# Patient Record
Sex: Female | Born: 1935 | Race: White | Hispanic: No | Marital: Married | Smoking: Never smoker
Health system: Southern US, Community
[De-identification: ages and names within clinical notes are randomized; demographics above are authoritative.]

## PROBLEM LIST (undated history)

## (undated) DIAGNOSIS — C4360 Malignant melanoma of unspecified upper limb, including shoulder: Secondary | ICD-10-CM

## (undated) DIAGNOSIS — I10 Essential (primary) hypertension: Secondary | ICD-10-CM

## (undated) DIAGNOSIS — R112 Nausea with vomiting, unspecified: Secondary | ICD-10-CM

## (undated) DIAGNOSIS — C50919 Malignant neoplasm of unspecified site of unspecified female breast: Secondary | ICD-10-CM

## (undated) DIAGNOSIS — Z9889 Other specified postprocedural states: Secondary | ICD-10-CM

## (undated) HISTORY — PX: ABDOMINAL HYSTERECTOMY: SHX81

## (undated) HISTORY — PX: TONSILLECTOMY: SUR1361

## (undated) HISTORY — PX: ACHILLES TENDON REPAIR: SUR1153

## (undated) HISTORY — PX: KNEE ARTHROSCOPY: SUR90

## (undated) HISTORY — PX: APPENDECTOMY: SHX54

## (undated) HISTORY — PX: EYE SURGERY: SHX253

---

## 1984-01-08 HISTORY — PX: BREAST EXCISIONAL BIOPSY: SUR124

## 1992-01-08 HISTORY — PX: BREAST SURGERY: SHX581

## 1992-01-08 HISTORY — PX: MASTECTOMY: SHX3

## 1997-04-14 ENCOUNTER — Other Ambulatory Visit: Admission: RE | Admit: 1997-04-14 | Discharge: 1997-04-14 | Payer: Self-pay | Admitting: Oncology

## 1997-05-31 ENCOUNTER — Other Ambulatory Visit: Admission: RE | Admit: 1997-05-31 | Discharge: 1997-05-31 | Payer: Self-pay | Admitting: Oncology

## 1998-11-09 ENCOUNTER — Encounter: Payer: Self-pay | Admitting: Oncology

## 1998-11-09 ENCOUNTER — Encounter: Admission: RE | Admit: 1998-11-09 | Discharge: 1998-11-09 | Payer: Self-pay | Admitting: Oncology

## 1999-11-21 ENCOUNTER — Encounter: Payer: Self-pay | Admitting: Oncology

## 1999-11-21 ENCOUNTER — Encounter: Admission: RE | Admit: 1999-11-21 | Discharge: 1999-11-21 | Payer: Self-pay | Admitting: Oncology

## 1999-11-27 ENCOUNTER — Other Ambulatory Visit: Admission: RE | Admit: 1999-11-27 | Discharge: 1999-11-27 | Payer: Self-pay | Admitting: Gynecology

## 2000-11-24 ENCOUNTER — Encounter: Payer: Self-pay | Admitting: Oncology

## 2000-11-24 ENCOUNTER — Encounter: Admission: RE | Admit: 2000-11-24 | Discharge: 2000-11-24 | Payer: Self-pay | Admitting: Oncology

## 2001-11-26 ENCOUNTER — Encounter: Payer: Self-pay | Admitting: Oncology

## 2001-11-26 ENCOUNTER — Encounter: Admission: RE | Admit: 2001-11-26 | Discharge: 2001-11-26 | Payer: Self-pay | Admitting: Oncology

## 2002-11-29 ENCOUNTER — Encounter: Admission: RE | Admit: 2002-11-29 | Discharge: 2002-11-29 | Payer: Self-pay | Admitting: Oncology

## 2003-01-24 ENCOUNTER — Other Ambulatory Visit: Admission: RE | Admit: 2003-01-24 | Discharge: 2003-01-24 | Payer: Self-pay | Admitting: Gynecology

## 2003-12-06 ENCOUNTER — Encounter: Admission: RE | Admit: 2003-12-06 | Discharge: 2003-12-06 | Payer: Self-pay | Admitting: Oncology

## 2004-08-13 ENCOUNTER — Ambulatory Visit: Payer: Self-pay | Admitting: Oncology

## 2004-12-07 ENCOUNTER — Encounter: Admission: RE | Admit: 2004-12-07 | Discharge: 2004-12-07 | Payer: Self-pay | Admitting: Oncology

## 2005-08-16 ENCOUNTER — Ambulatory Visit: Payer: Self-pay | Admitting: Oncology

## 2005-08-20 LAB — CBC WITH DIFFERENTIAL/PLATELET
Basophils Absolute: 0 10*3/uL (ref 0.0–0.1)
HGB: 14.5 g/dL (ref 11.6–15.9)
MCV: 85.7 fL (ref 81.0–101.0)
MONO#: 0.6 10*3/uL (ref 0.1–0.9)
MONO%: 10.9 % (ref 0.0–13.0)
NEUT#: 2.5 10*3/uL (ref 1.5–6.5)
NEUT%: 46.3 % (ref 39.6–76.8)
Platelets: 227 10*3/uL (ref 145–400)
RBC: 4.96 10*6/uL (ref 3.70–5.32)
WBC: 5.4 10*3/uL (ref 3.9–10.0)
lymph#: 2.1 10*3/uL (ref 0.9–3.3)

## 2005-08-20 LAB — COMPREHENSIVE METABOLIC PANEL
ALT: 18 U/L (ref 0–40)
Alkaline Phosphatase: 66 U/L (ref 39–117)
BUN: 26 mg/dL — ABNORMAL HIGH (ref 6–23)
Calcium: 10.3 mg/dL (ref 8.4–10.5)
Chloride: 103 mEq/L (ref 96–112)
Creatinine, Ser: 0.97 mg/dL (ref 0.40–1.20)
Total Protein: 7.3 g/dL (ref 6.0–8.3)

## 2005-08-20 LAB — LACTATE DEHYDROGENASE: LDH: 148 U/L (ref 94–250)

## 2005-12-10 ENCOUNTER — Encounter: Admission: RE | Admit: 2005-12-10 | Discharge: 2005-12-10 | Payer: Self-pay | Admitting: Oncology

## 2006-08-15 ENCOUNTER — Ambulatory Visit: Payer: Self-pay | Admitting: Oncology

## 2006-08-19 LAB — COMPREHENSIVE METABOLIC PANEL
Alkaline Phosphatase: 69 U/L (ref 39–117)
BUN: 22 mg/dL (ref 6–23)
CO2: 28 mEq/L (ref 19–32)
Chloride: 103 mEq/L (ref 96–112)
Creatinine, Ser: 0.89 mg/dL (ref 0.40–1.20)
Glucose, Bld: 95 mg/dL (ref 70–99)
Potassium: 4.1 mEq/L (ref 3.5–5.3)
Sodium: 142 mEq/L (ref 135–145)
Total Bilirubin: 0.5 mg/dL (ref 0.3–1.2)
Total Protein: 7.3 g/dL (ref 6.0–8.3)

## 2006-08-19 LAB — CBC WITH DIFFERENTIAL/PLATELET
EOS%: 1.5 % (ref 0.0–7.0)
Eosinophils Absolute: 0.1 10*3/uL (ref 0.0–0.5)
HCT: 42.7 % (ref 34.8–46.6)
Platelets: 210 10*3/uL (ref 145–400)
RBC: 5.02 10*6/uL (ref 3.70–5.32)
WBC: 6.4 10*3/uL (ref 3.9–10.0)

## 2006-12-19 ENCOUNTER — Encounter: Admission: RE | Admit: 2006-12-19 | Discharge: 2006-12-19 | Payer: Self-pay | Admitting: Oncology

## 2007-08-14 ENCOUNTER — Ambulatory Visit: Payer: Self-pay | Admitting: Oncology

## 2007-12-23 ENCOUNTER — Encounter: Admission: RE | Admit: 2007-12-23 | Discharge: 2007-12-23 | Payer: Self-pay | Admitting: Oncology

## 2009-01-25 ENCOUNTER — Encounter: Admission: RE | Admit: 2009-01-25 | Discharge: 2009-01-25 | Payer: Self-pay | Admitting: Gynecology

## 2010-01-27 ENCOUNTER — Encounter: Payer: Self-pay | Admitting: Oncology

## 2010-01-31 ENCOUNTER — Encounter
Admission: RE | Admit: 2010-01-31 | Discharge: 2010-01-31 | Payer: Self-pay | Source: Home / Self Care | Attending: Gynecology | Admitting: Gynecology

## 2010-12-24 ENCOUNTER — Other Ambulatory Visit: Payer: Self-pay | Admitting: Gynecology

## 2010-12-24 DIAGNOSIS — Z1231 Encounter for screening mammogram for malignant neoplasm of breast: Secondary | ICD-10-CM

## 2010-12-24 DIAGNOSIS — Z9889 Other specified postprocedural states: Secondary | ICD-10-CM

## 2011-02-11 ENCOUNTER — Ambulatory Visit
Admission: RE | Admit: 2011-02-11 | Discharge: 2011-02-11 | Disposition: A | Discharge status: Home / Self Care | Payer: Medicare Other | Source: Ambulatory Visit | Attending: Gynecology | Admitting: Gynecology

## 2011-02-11 DIAGNOSIS — Z1231 Encounter for screening mammogram for malignant neoplasm of breast: Secondary | ICD-10-CM

## 2011-02-11 DIAGNOSIS — Z9889 Other specified postprocedural states: Secondary | ICD-10-CM

## 2012-02-24 ENCOUNTER — Other Ambulatory Visit: Payer: Self-pay | Admitting: Gynecology

## 2012-02-24 DIAGNOSIS — Z853 Personal history of malignant neoplasm of breast: Secondary | ICD-10-CM

## 2012-02-24 DIAGNOSIS — Z9011 Acquired absence of right breast and nipple: Secondary | ICD-10-CM

## 2012-02-24 DIAGNOSIS — Z1231 Encounter for screening mammogram for malignant neoplasm of breast: Secondary | ICD-10-CM

## 2012-03-27 ENCOUNTER — Ambulatory Visit
Admission: RE | Admit: 2012-03-27 | Discharge: 2012-03-27 | Disposition: A | Discharge status: Home / Self Care | Payer: Medicare Other | Source: Ambulatory Visit | Attending: Gynecology | Admitting: Gynecology

## 2012-03-27 DIAGNOSIS — Z853 Personal history of malignant neoplasm of breast: Secondary | ICD-10-CM

## 2012-03-27 DIAGNOSIS — Z1231 Encounter for screening mammogram for malignant neoplasm of breast: Secondary | ICD-10-CM

## 2012-03-27 DIAGNOSIS — Z9011 Acquired absence of right breast and nipple: Secondary | ICD-10-CM

## 2013-04-23 ENCOUNTER — Other Ambulatory Visit: Payer: Self-pay

## 2013-04-23 DIAGNOSIS — Z1231 Encounter for screening mammogram for malignant neoplasm of breast: Secondary | ICD-10-CM

## 2013-05-12 ENCOUNTER — Encounter (INDEPENDENT_AMBULATORY_CARE_PROVIDER_SITE_OTHER): Payer: Self-pay

## 2013-05-12 ENCOUNTER — Ambulatory Visit
Admission: RE | Admit: 2013-05-12 | Discharge: 2013-05-12 | Disposition: A | Discharge status: Home / Self Care | Payer: Medicare Other | Source: Ambulatory Visit

## 2013-05-12 DIAGNOSIS — Z1231 Encounter for screening mammogram for malignant neoplasm of breast: Secondary | ICD-10-CM

## 2013-07-14 ENCOUNTER — Encounter (HOSPITAL_BASED_OUTPATIENT_CLINIC_OR_DEPARTMENT_OTHER): Payer: Self-pay | Admitting: *Deleted

## 2013-07-14 NOTE — H&P (Signed)
  Subjective:   Patient ID: Mindy Meyer is a 80 y.o. female.  HPI  Referred by Dr. Ubaldo Glassing for consultation following recent shave biopsy back lesion demonstrating 0.24 mm melanoma, per path report deep margin clear and peripheral margins involved. Patient has hx multiple BCC, this is first melanoma diagnosis. Lesion has been present for few years time, no history ulceration or bleeding. Has history of right mastectomy with lymph node sampling for breast cancer. Last MMG this spring.  Review of Systems  Musculoskeletal: Positive for back pain.    Objective:   Physical Exam  Cardiovascular: Normal rate and regular rhythm.  Pulmonary/Chest: Breath sounds normal.  Abdominal: Soft.  Lymphadenopathy:  Head (right side): No occipital adenopathy present.  Head (left side): No occipital adenopathy present.  She has no cervical adenopathy.  She has no axillary adenopathy.  Right: No inguinal adenopathy present.  Left: No inguinal adenopathy present.  Skin:  Left upper back with open biopsy site, area of lighter pigmentation surrounding total area 3.5 x 3 cm    Assessment:    Melanoma of back   Plan:    Reviewed biopsy results and copy provided. Given current depth no recommendation for SLN. Will plan WLE with 1-2 cm margins. Will plan VAC at time of excision as we await final pathology and plan second stage surgery with skin graft and continued use VAC. May require home health during the course of this. Reviewed need for additional procedures or oncology referral pending final pathology. Reviewed prolonged wound care if graft does not take well. Patient reports significant nausea on days 2-3 post surgery, does not tolerate any -odones pain medication, Has not tried [employer]

## 2013-07-16 ENCOUNTER — Encounter (HOSPITAL_BASED_OUTPATIENT_CLINIC_OR_DEPARTMENT_OTHER)
Admission: RE | Disposition: A | Discharge status: Home / Self Care | Payer: Medicare Other | Source: Ambulatory Visit | Attending: Plastic Surgery

## 2013-07-16 ENCOUNTER — Encounter (HOSPITAL_BASED_OUTPATIENT_CLINIC_OR_DEPARTMENT_OTHER): Payer: Medicare Other | Admitting: Anesthesiology

## 2013-07-16 ENCOUNTER — Ambulatory Visit (HOSPITAL_BASED_OUTPATIENT_CLINIC_OR_DEPARTMENT_OTHER)
Admission: RE | Admit: 2013-07-16 | Discharge: 2013-07-16 | Disposition: A | Discharge status: Home / Self Care | Payer: Medicare Other | Source: Ambulatory Visit | Attending: Plastic Surgery | Admitting: Plastic Surgery

## 2013-07-16 ENCOUNTER — Ambulatory Visit (HOSPITAL_BASED_OUTPATIENT_CLINIC_OR_DEPARTMENT_OTHER): Payer: Medicare Other | Admitting: Anesthesiology

## 2013-07-16 ENCOUNTER — Encounter (HOSPITAL_BASED_OUTPATIENT_CLINIC_OR_DEPARTMENT_OTHER): Payer: Self-pay

## 2013-07-16 DIAGNOSIS — C439 Malignant melanoma of skin, unspecified: Secondary | ICD-10-CM

## 2013-07-16 DIAGNOSIS — I10 Essential (primary) hypertension: Secondary | ICD-10-CM | POA: Insufficient documentation

## 2013-07-16 DIAGNOSIS — C4359 Malignant melanoma of other part of trunk: Secondary | ICD-10-CM | POA: Insufficient documentation

## 2013-07-16 DIAGNOSIS — Z9010 Acquired absence of unspecified breast and nipple: Secondary | ICD-10-CM | POA: Insufficient documentation

## 2013-07-16 DIAGNOSIS — Z853 Personal history of malignant neoplasm of breast: Secondary | ICD-10-CM | POA: Insufficient documentation

## 2013-07-16 HISTORY — PX: MASS EXCISION: SHX2000

## 2013-07-16 HISTORY — DX: Nausea with vomiting, unspecified: R11.2

## 2013-07-16 HISTORY — DX: Other specified postprocedural states: Z98.890

## 2013-07-16 HISTORY — DX: Malignant melanoma of unspecified upper limb, including shoulder: C43.60

## 2013-07-16 HISTORY — DX: Essential (primary) hypertension: I10

## 2013-07-16 LAB — CBC WITH DIFFERENTIAL/PLATELET
Basophils Absolute: 0 10*3/uL (ref 0.0–0.1)
Basophils Relative: 1 % (ref 0–1)
Eosinophils Absolute: 0.2 10*3/uL (ref 0.0–0.7)
Eosinophils Relative: 2 % (ref 0–5)
HCT: 42.4 % (ref 36.0–46.0)
Hemoglobin: 13.9 g/dL (ref 12.0–15.0)
LYMPHS ABS: 1.9 10*3/uL (ref 0.7–4.0)
LYMPHS PCT: 29 % (ref 12–46)
MCH: 28.7 pg (ref 26.0–34.0)
MCHC: 32.8 g/dL (ref 30.0–36.0)
MCV: 87.6 fL (ref 78.0–100.0)
MONOS PCT: 12 % (ref 3–12)
Monocytes Absolute: 0.8 10*3/uL (ref 0.1–1.0)
NEUTROS PCT: 56 % (ref 43–77)
Neutro Abs: 3.7 10*3/uL (ref 1.7–7.7)
Platelets: 210 10*3/uL (ref 150–400)
RBC: 4.84 MIL/uL (ref 3.87–5.11)
RDW: 13.9 % (ref 11.5–15.5)
WBC: 6.5 10*3/uL (ref 4.0–10.5)

## 2013-07-16 SURGERY — EXCISION MASS
Anesthesia: Monitor Anesthesia Care | Site: Back

## 2013-07-16 MED ORDER — TRAMADOL HCL 50 MG PO TABS
ORAL_TABLET | ORAL | Status: AC
  Filled 2013-07-16: qty 1

## 2013-07-16 MED ORDER — ONDANSETRON HCL 4 MG/2ML IJ SOLN
4.0000 mg | Freq: Once | INTRAMUSCULAR | Status: DC | PRN
Start: 2013-07-16 — End: 2013-07-16

## 2013-07-16 MED ORDER — FENTANYL CITRATE 0.05 MG/ML IJ SOLN
INTRAMUSCULAR | Status: AC
  Filled 2013-07-16: qty 4

## 2013-07-16 MED ORDER — PROPOFOL INFUSION 10 MG/ML OPTIME
INTRAVENOUS | Status: DC | PRN
  Administered 2013-07-16: 50 ug/kg/min via INTRAVENOUS

## 2013-07-16 MED ORDER — CHLORHEXIDINE GLUCONATE 4 % EX LIQD: 1.0000 "application " | Freq: Once | CUTANEOUS | Status: DC

## 2013-07-16 MED ORDER — BUPIVACAINE-EPINEPHRINE (PF) 0.25% -1:200000 IJ SOLN
INTRAMUSCULAR | Status: AC
  Filled 2013-07-16: qty 30

## 2013-07-16 MED ORDER — CEFAZOLIN SODIUM-DEXTROSE 2-3 GM-% IV SOLR
2.0000 g | INTRAVENOUS | Status: AC
  Administered 2013-07-16: 2 g via INTRAVENOUS

## 2013-07-16 MED ORDER — BUPIVACAINE-EPINEPHRINE 0.25% -1:200000 IJ SOLN
INTRAMUSCULAR | Status: DC | PRN
  Administered 2013-07-16: 16 mL

## 2013-07-16 MED ORDER — CEFAZOLIN SODIUM-DEXTROSE 2-3 GM-% IV SOLR
INTRAVENOUS | Status: AC
  Filled 2013-07-16: qty 50

## 2013-07-16 MED ORDER — FENTANYL CITRATE 0.05 MG/ML IJ SOLN: 25.0000 ug | INTRAMUSCULAR | Status: DC | PRN

## 2013-07-16 MED ORDER — LACTATED RINGERS IV SOLN
INTRAVENOUS | Status: DC
Start: 2013-07-16 — End: 2013-07-16
  Administered 2013-07-16: 07:00:00 via INTRAVENOUS

## 2013-07-16 MED ORDER — FENTANYL CITRATE 0.05 MG/ML IJ SOLN
INTRAMUSCULAR | Status: DC | PRN
Start: 2013-07-16 — End: 2013-07-16
  Administered 2013-07-16: 25 ug via INTRAVENOUS

## 2013-07-16 MED ORDER — LIDOCAINE HCL (CARDIAC) 10 MG/ML IV SOLN
INTRAVENOUS | Status: DC | PRN
  Administered 2013-07-16: 50 mg via INTRAVENOUS

## 2013-07-16 MED ORDER — TRAMADOL HCL 50 MG PO TABS: 25.0000 mg | ORAL_TABLET | Freq: Four times a day (QID) | ORAL | Status: DC | PRN

## 2013-07-16 MED ORDER — ONDANSETRON HCL 4 MG/2ML IJ SOLN
INTRAMUSCULAR | Status: DC | PRN
  Administered 2013-07-16: 4 mg via INTRAVENOUS

## 2013-07-16 MED ORDER — MIDAZOLAM HCL 2 MG/2ML IJ SOLN: 1.0000 mg | INTRAMUSCULAR | Status: DC | PRN

## 2013-07-16 MED ORDER — BACITRACIN ZINC 500 UNIT/GM EX OINT
TOPICAL_OINTMENT | CUTANEOUS | Status: AC
  Filled 2013-07-16: qty 0.9

## 2013-07-16 MED ORDER — TRAMADOL HCL 50 MG PO TABS
50.0000 mg | ORAL_TABLET | Freq: Once | ORAL | Status: AC
  Administered 2013-07-16: 50 mg via ORAL

## 2013-07-16 MED ORDER — FENTANYL CITRATE 0.05 MG/ML IJ SOLN: 50.0000 ug | INTRAMUSCULAR | Status: DC | PRN

## 2013-07-16 MED ORDER — LIDOCAINE-EPINEPHRINE 1 %-1:100000 IJ SOLN
INTRAMUSCULAR | Status: AC
  Filled 2013-07-16: qty 1

## 2013-07-16 SURGICAL SUPPLY — 56 items
BENZOIN TINCTURE PRP APPL 2/3 (GAUZE/BANDAGES/DRESSINGS) ×3 IMPLANT
BLADE SURG 10 STRL SS (BLADE) ×3 IMPLANT
BLADE SURG 15 STRL LF DISP TIS (BLADE) ×1 IMPLANT
BLADE SURG 15 STRL SS (BLADE) ×2
BLADE SURG ROTATE 9660 (MISCELLANEOUS) IMPLANT
CANISTER SUCT 1200ML W/VALVE (MISCELLANEOUS) IMPLANT
CHLORAPREP W/TINT 26ML (MISCELLANEOUS) ×3 IMPLANT
CLOSURE WOUND 1/2 X4 (GAUZE/BANDAGES/DRESSINGS)
COVER MAYO STAND STRL (DRAPES) ×3 IMPLANT
COVER TABLE BACK 60X90 (DRAPES) ×3 IMPLANT
DERMABOND ADVANCED (GAUZE/BANDAGES/DRESSINGS)
DERMABOND ADVANCED .7 DNX12 (GAUZE/BANDAGES/DRESSINGS) IMPLANT
DRAIN JP 10F RND SILICONE (MISCELLANEOUS) IMPLANT
DRAPE PED LAPAROTOMY (DRAPES) ×3 IMPLANT
DRAPE U-SHAPE 76X120 STRL (DRAPES) IMPLANT
ELECT COATED BLADE 2.86 ST (ELECTRODE) IMPLANT
ELECT NEEDLE BLADE 2-5/6 (NEEDLE) ×3 IMPLANT
ELECT REM PT RETURN 9FT ADLT (ELECTROSURGICAL)
ELECT REM PT RETURN 9FT PED (ELECTROSURGICAL)
ELECTRODE REM PT RETRN 9FT PED (ELECTROSURGICAL) IMPLANT
ELECTRODE REM PT RTRN 9FT ADLT (ELECTROSURGICAL) IMPLANT
EVACUATOR SILICONE 100CC (DRAIN) IMPLANT
GAUZE XEROFORM 1X8 LF (GAUZE/BANDAGES/DRESSINGS) IMPLANT
GLOVE BIO SURGEON STRL SZ 6 (GLOVE) ×3 IMPLANT
GLOVE BIOGEL PI IND STRL 7.0 (GLOVE) ×2 IMPLANT
GLOVE BIOGEL PI INDICATOR 7.0 (GLOVE) ×4
GLOVE ECLIPSE 7.0 STRL STRAW (GLOVE) ×3 IMPLANT
GLOVE SURG SS PI 7.0 STRL IVOR (GLOVE) ×3 IMPLANT
GOWN STRL REUS W/ TWL LRG LVL3 (GOWN DISPOSABLE) ×3 IMPLANT
GOWN STRL REUS W/TWL LRG LVL3 (GOWN DISPOSABLE) ×6
NEEDLE 27GAX1X1/2 (NEEDLE) ×3 IMPLANT
NEEDLE HYPO 30GX1 BEV (NEEDLE) IMPLANT
NS IRRIG 1000ML POUR BTL (IV SOLUTION) IMPLANT
PACK BASIN DAY SURGERY FS (CUSTOM PROCEDURE TRAY) ×3 IMPLANT
PENCIL BUTTON HOLSTER BLD 10FT (ELECTRODE) ×3 IMPLANT
RUBBERBAND STERILE (MISCELLANEOUS) IMPLANT
SHEET MEDIUM DRAPE 40X70 STRL (DRAPES) ×3 IMPLANT
SPONGE GAUZE 2X2 8PLY STER LF (GAUZE/BANDAGES/DRESSINGS)
SPONGE GAUZE 2X2 8PLY STRL LF (GAUZE/BANDAGES/DRESSINGS) IMPLANT
SPONGE GAUZE 4X4 12PLY STER LF (GAUZE/BANDAGES/DRESSINGS) IMPLANT
SPONGE LAP 18X18 X RAY DECT (DISPOSABLE) IMPLANT
STRIP CLOSURE SKIN 1/2X4 (GAUZE/BANDAGES/DRESSINGS) IMPLANT
SUCTION FRAZIER TIP 10 FR DISP (SUCTIONS) IMPLANT
SUT ETHILON 4 0 PS 2 18 (SUTURE) IMPLANT
SUT ETHILON 5 0 P 3 18 (SUTURE)
SUT MNCRL AB 4-0 PS2 18 (SUTURE) IMPLANT
SUT NYLON ETHILON 5-0 P-3 1X18 (SUTURE) IMPLANT
SUT PLAIN 5 0 P 3 18 (SUTURE) IMPLANT
SUT SILK 3 0 SH 30 (SUTURE) ×3 IMPLANT
SUT VICRYL 4-0 PS2 18IN ABS (SUTURE) IMPLANT
SYR BULB 3OZ (MISCELLANEOUS) IMPLANT
SYRINGE CONTROL L 12CC (SYRINGE) ×3 IMPLANT
TOWEL OR 17X24 6PK STRL BLUE (TOWEL DISPOSABLE) ×3 IMPLANT
TRAY DSU PREP LF (CUSTOM PROCEDURE TRAY) IMPLANT
TUBE CONNECTING 20'X1/4 (TUBING)
TUBE CONNECTING 20X1/4 (TUBING) IMPLANT

## 2013-07-16 NOTE — Transfer of Care (Signed)
Immediate Anesthesia Transfer of Care Note  Patient: Mindy Meyer  Procedure(s) Performed: Procedure(s): EXCISION OF MALIGNANT LESION ON BACK (N/A)  Patient Location: PACU  Anesthesia Type:MAC  Level of Consciousness: awake, alert , oriented and patient cooperative  Airway & Oxygen Therapy: Patient Spontanous Breathing and Patient connected to face mask oxygen  Post-op Assessment: Report given to PACU RN and Post -op Vital signs reviewed and stable  Post vital signs: Reviewed and stable  Complications: No apparent anesthesia complications

## 2013-07-16 NOTE — Interval H&P Note (Signed)
History and Physical Interval Note:  07/16/2013 7:05 AM  Mindy Meyer  has presented today for surgery, with the diagnosis of Melanoma of skin, site unspecified  The various methods of treatment have been discussed with the patient and family. After consideration of risks, benefits and other options for treatment, the patient has consented to  Procedure(s): EXCISION OF MALIGNANT LESION ON BACK (N/A) as a surgical intervention .  The patient's history has been reviewed, patient examined, no change in status, stable for surgery.  I have reviewed the patient's chart and labs.  Questions were answered to the patient's satisfaction.     Bertina Guthridge

## 2013-07-16 NOTE — Anesthesia Postprocedure Evaluation (Signed)
  Anesthesia Post-op Note  Patient: Mindy Meyer  Procedure(s) Performed: Procedure(s): EXCISION OF MALIGNANT LESION ON BACK (N/A)  Patient Location: PACU  Anesthesia Type: MAC   Level of Consciousness: awake, alert  and oriented  Airway and Oxygen Therapy: Patient Spontanous Breathing  Post-op Pain: none  Post-op Assessment: Post-op Vital signs reviewed  Post-op Vital Signs: Reviewed  Last Vitals:  Filed Vitals:   07/16/13 0830  BP: 154/53  Pulse: 62  Temp:   Resp: 12    Complications: No apparent anesthesia complications

## 2013-07-16 NOTE — Anesthesia Preprocedure Evaluation (Addendum)
Anesthesia Evaluation  Patient identified by MRN, date of birth, ID band Patient awake    Reviewed: Allergy & Precautions, H&P , NPO status , Patient's Chart, lab work & pertinent test results  History of Anesthesia Complications (+) PONV and history of anesthetic complications (Mostly from pain meds day after surgery)  Airway Mallampati: I TM Distance: >3 FB Neck ROM: Full    Dental  (+) Teeth Intact, Dental Advisory Given   Pulmonary  breath sounds clear to auscultation        Cardiovascular hypertension, Pt. on medications Rhythm:Regular Rate:Normal     Neuro/Psych    GI/Hepatic   Endo/Other    Renal/GU      Musculoskeletal   Abdominal   Peds  Hematology   Anesthesia Other Findings   Reproductive/Obstetrics                          Anesthesia Physical Anesthesia Plan  ASA: II  Anesthesia Plan: MAC   Post-op Pain Management:    Induction: Intravenous  Airway Management Planned: Simple Face Mask  Additional Equipment:   Intra-op Plan:   Post-operative Plan:   Informed Consent: I have reviewed the patients History and Physical, chart, labs and discussed the procedure including the risks, benefits and alternatives for the proposed anesthesia with the patient or authorized representative who has indicated his/her understanding and acceptance.   Dental advisory given  Plan Discussed with: CRNA, Anesthesiologist and Surgeon  Anesthesia Plan Comments:         Anesthesia Quick Evaluation

## 2013-07-16 NOTE — Op Note (Signed)
Operative Note   DATE OF OPERATION: 7.10.2015  LOCATION: [facility]-outpatient  SURGICAL DIVISION: Plastic Surgery  PREOPERATIVE DIAGNOSES:  Melanoma back  POSTOPERATIVE DIAGNOSES:  same  PROCEDURE:  Excision malignant lesion back 5.5 cm, application wound VAC  SURGEON: Irene Limbo MD MBA  ASSISTANT: none  ANESTHESIA:  MAC.   EBL: minimal  COMPLICATIONS: None immediate.   INDICATIONS FOR PROCEDURE:  The patient, Mindy Meyer, is a 80 y.o. female born on 1925/07/03, is here for wide excision of lesion  0.24 mm superficial spreading melanoma on shave biopsy. No history ulceration or bleeding of lesion. Has had adjacent benign lesions treated by cryotherapy recently.    FINDINGS: Healing biopsy site centrally with surrounding lightly pigmented area. Medially, portion of lesions recently treated with liquid nitrogen were included in specimen resection. Plan second stage procedure for skin grafting once pathologic clearance obtained.  DESCRIPTION OF PROCEDURE:  The patient's operative site was marked with the patient in the preoperative area. The patient was taken to the operating room. SCDs were placed and IV antibiotics were given. The patient's operative site was prepped and draped in a sterile fashion. A time out was performed and all information was confirmed to be correct.  One cm margins marked from all visible pigmented borders. Area infiltrated with local anesthetic. Sharp excision of lesion including underlying fascia completed. Specimen marked for pathology. Hemostasis obtained. Wound VAC applied and set to 125 mm Hg continuous.  The patient was allowed to wake from anesthesia, extubated and taken to the recovery room in satisfactory condition.   SPECIMENS: melanoma back  DRAINS: none  Irene Limbo, MD [facility] Plastic & Reconstructive Surgery [phone]

## 2013-07-16 NOTE — Discharge Instructions (Signed)

## 2013-07-19 ENCOUNTER — Encounter (HOSPITAL_BASED_OUTPATIENT_CLINIC_OR_DEPARTMENT_OTHER): Payer: Self-pay | Admitting: Plastic Surgery

## 2013-07-19 LAB — POCT I-STAT, CHEM 8
BUN: 17 mg/dL (ref 6–23)
Calcium, Ion: 1.29 mmol/L (ref 1.13–1.30)
Chloride: 100 mEq/L (ref 96–112)
Creatinine, Ser: 0.9 mg/dL (ref 0.50–1.10)
Glucose, Bld: 110 mg/dL — ABNORMAL HIGH (ref 70–99)
HCT: 46 % (ref 36.0–46.0)
HEMOGLOBIN: 15.6 g/dL — AB (ref 12.0–15.0)
Potassium: 3.6 mEq/L — ABNORMAL LOW (ref 3.7–5.3)
SODIUM: 143 meq/L (ref 137–147)
TCO2: 29 mmol/L (ref 0–100)

## 2013-07-20 ENCOUNTER — Encounter (HOSPITAL_BASED_OUTPATIENT_CLINIC_OR_DEPARTMENT_OTHER): Payer: Self-pay | Admitting: *Deleted

## 2013-07-21 NOTE — H&P (Signed)
  Patient ID: Mindy Meyer is a 80 y.o. female.  HPI   Patient returns for skin grafting post WLE melanoma last week. Pathology with residual melanoma, 0.2 mm, margins clear. Prior shave biopsy back lesion demonstrated 0.24 mm melanoma, per path report deep margin clear and peripheral margins involved. Patient has hx multiple BCC, this is first melanoma diagnosis. Lesion has been present for few years time, no history ulceration or bleeding. Has history of right mastectomy with lymph node sampling for breast cancer.  Review of Systems  Musculoskeletal: Positive for back pain.    Objective:   Physical Exam  Cardiovascular: Normal rate and regular rhythm.  Pulmonary/Chest: Breath sounds normal.  Back: VAC in place    Assessment:    Melanoma of back   Plan:    Plan coverage of wound with split thickness graft. Use of VAC as bolster planned. Discussed risk of poor wound healing, need for additional procedures, recurrence, unacceptable cosmetic appearance, depressed nature of scar.   Irene Limbo, MD [facility] Plastic & Reconstructive Surgery [phone]

## 2013-07-23 ENCOUNTER — Ambulatory Visit (HOSPITAL_BASED_OUTPATIENT_CLINIC_OR_DEPARTMENT_OTHER): Payer: Medicare Other | Admitting: Anesthesiology

## 2013-07-23 ENCOUNTER — Encounter (HOSPITAL_BASED_OUTPATIENT_CLINIC_OR_DEPARTMENT_OTHER): Payer: Medicare Other | Admitting: Anesthesiology

## 2013-07-23 ENCOUNTER — Ambulatory Visit (HOSPITAL_BASED_OUTPATIENT_CLINIC_OR_DEPARTMENT_OTHER)
Admission: RE | Admit: 2013-07-23 | Discharge: 2013-07-23 | Disposition: A | Discharge status: Home / Self Care | Payer: Medicare Other | Source: Ambulatory Visit | Attending: Plastic Surgery | Admitting: Plastic Surgery

## 2013-07-23 ENCOUNTER — Encounter (HOSPITAL_BASED_OUTPATIENT_CLINIC_OR_DEPARTMENT_OTHER)
Admission: RE | Disposition: A | Discharge status: Home / Self Care | Payer: Self-pay | Source: Ambulatory Visit | Attending: Plastic Surgery

## 2013-07-23 ENCOUNTER — Encounter (HOSPITAL_BASED_OUTPATIENT_CLINIC_OR_DEPARTMENT_OTHER): Payer: Self-pay | Admitting: *Deleted

## 2013-07-23 DIAGNOSIS — Z428 Encounter for other plastic and reconstructive surgery following medical procedure or healed injury: Secondary | ICD-10-CM | POA: Diagnosis not present

## 2013-07-23 DIAGNOSIS — C4359 Malignant melanoma of other part of trunk: Secondary | ICD-10-CM

## 2013-07-23 DIAGNOSIS — Z85820 Personal history of malignant melanoma of skin: Secondary | ICD-10-CM | POA: Diagnosis not present

## 2013-07-23 DIAGNOSIS — Z853 Personal history of malignant neoplasm of breast: Secondary | ICD-10-CM | POA: Insufficient documentation

## 2013-07-23 DIAGNOSIS — Z9010 Acquired absence of unspecified breast and nipple: Secondary | ICD-10-CM | POA: Insufficient documentation

## 2013-07-23 DIAGNOSIS — Z85828 Personal history of other malignant neoplasm of skin: Secondary | ICD-10-CM | POA: Insufficient documentation

## 2013-07-23 DIAGNOSIS — I10 Essential (primary) hypertension: Secondary | ICD-10-CM | POA: Insufficient documentation

## 2013-07-23 HISTORY — PX: SKIN SPLIT GRAFT: SHX444

## 2013-07-23 SURGERY — APPLICATION, GRAFT, SKIN, SPLIT-THICKNESS
Anesthesia: General | Site: Thigh | Laterality: Left

## 2013-07-23 MED ORDER — BACITRACIN ZINC 500 UNIT/GM EX OINT
TOPICAL_OINTMENT | CUTANEOUS | Status: AC
  Filled 2013-07-23: qty 56.7

## 2013-07-23 MED ORDER — FENTANYL CITRATE 0.05 MG/ML IJ SOLN
25.0000 ug | INTRAMUSCULAR | Status: DC | PRN
  Administered 2013-07-23: 25 ug via INTRAVENOUS
  Administered 2013-07-23: 50 ug via INTRAVENOUS

## 2013-07-23 MED ORDER — SUCCINYLCHOLINE CHLORIDE 20 MG/ML IJ SOLN
INTRAMUSCULAR | Status: AC
  Filled 2013-07-23: qty 1

## 2013-07-23 MED ORDER — SCOPOLAMINE 1 MG/3DAYS TD PT72
MEDICATED_PATCH | TRANSDERMAL | Status: AC
  Filled 2013-07-23: qty 1

## 2013-07-23 MED ORDER — MINERAL OIL LIGHT 100 % EX OIL
TOPICAL_OIL | CUTANEOUS | Status: AC
  Filled 2013-07-23: qty 50

## 2013-07-23 MED ORDER — FENTANYL CITRATE 0.05 MG/ML IJ SOLN
INTRAMUSCULAR | Status: AC
  Filled 2013-07-23: qty 6

## 2013-07-23 MED ORDER — LIDOCAINE HCL (CARDIAC) 20 MG/ML IV SOLN
INTRAVENOUS | Status: DC | PRN
  Administered 2013-07-23: 30 mg via INTRAVENOUS

## 2013-07-23 MED ORDER — ONDANSETRON HCL 4 MG/2ML IJ SOLN
INTRAMUSCULAR | Status: DC | PRN
  Administered 2013-07-23: 4 mg via INTRAVENOUS

## 2013-07-23 MED ORDER — FENTANYL CITRATE 0.05 MG/ML IJ SOLN
INTRAMUSCULAR | Status: DC | PRN
  Administered 2013-07-23 (×2): 25 ug via INTRAVENOUS

## 2013-07-23 MED ORDER — LIDOCAINE-EPINEPHRINE 1 %-1:100000 IJ SOLN
INTRAMUSCULAR | Status: AC
  Filled 2013-07-23: qty 2

## 2013-07-23 MED ORDER — CEFAZOLIN SODIUM-DEXTROSE 2-3 GM-% IV SOLR: 2.0000 g | INTRAVENOUS | Status: DC

## 2013-07-23 MED ORDER — FENTANYL CITRATE 0.05 MG/ML IJ SOLN
INTRAMUSCULAR | Status: AC
  Filled 2013-07-23: qty 2

## 2013-07-23 MED ORDER — ONDANSETRON HCL 4 MG/2ML IJ SOLN: 4.0000 mg | Freq: Once | INTRAMUSCULAR | Status: DC | PRN

## 2013-07-23 MED ORDER — CEFAZOLIN SODIUM-DEXTROSE 2-3 GM-% IV SOLR
INTRAVENOUS | Status: AC
  Filled 2013-07-23: qty 50

## 2013-07-23 MED ORDER — MIDAZOLAM HCL 2 MG/2ML IJ SOLN: 1.0000 mg | INTRAMUSCULAR | Status: DC | PRN

## 2013-07-23 MED ORDER — BUPIVACAINE-EPINEPHRINE (PF) 0.25% -1:200000 IJ SOLN
INTRAMUSCULAR | Status: AC
  Filled 2013-07-23: qty 60

## 2013-07-23 MED ORDER — LACTATED RINGERS IV SOLN
INTRAVENOUS | Status: DC
  Administered 2013-07-23 (×2): via INTRAVENOUS

## 2013-07-23 MED ORDER — PROPOFOL 10 MG/ML IV BOLUS
INTRAVENOUS | Status: AC
  Filled 2013-07-23: qty 100

## 2013-07-23 MED ORDER — DEXAMETHASONE SODIUM PHOSPHATE 4 MG/ML IJ SOLN
INTRAMUSCULAR | Status: DC | PRN
  Administered 2013-07-23: 10 mg via INTRAVENOUS

## 2013-07-23 MED ORDER — MIDAZOLAM HCL 2 MG/2ML IJ SOLN
INTRAMUSCULAR | Status: AC
  Filled 2013-07-23: qty 2

## 2013-07-23 MED ORDER — SCOPOLAMINE 1 MG/3DAYS TD PT72
MEDICATED_PATCH | TRANSDERMAL | Status: DC | PRN
  Administered 2013-07-23: 1.5 mg via TRANSDERMAL

## 2013-07-23 MED ORDER — ONDANSETRON 8 MG PO TBDP: 8.0000 mg | ORAL_TABLET | Freq: Three times a day (TID) | ORAL | Status: DC | PRN

## 2013-07-23 MED ORDER — FENTANYL CITRATE 0.05 MG/ML IJ SOLN: 50.0000 ug | INTRAMUSCULAR | Status: DC | PRN

## 2013-07-23 MED ORDER — PROPOFOL 10 MG/ML IV BOLUS
INTRAVENOUS | Status: DC | PRN
  Administered 2013-07-23: 170 mg via INTRAVENOUS

## 2013-07-23 MED ORDER — BUPIVACAINE-EPINEPHRINE 0.25% -1:200000 IJ SOLN
INTRAMUSCULAR | Status: DC | PRN
  Administered 2013-07-23: 14 mL

## 2013-07-23 MED ORDER — MINERAL OIL LIGHT 100 % EX OIL
TOPICAL_OIL | CUTANEOUS | Status: DC | PRN
  Administered 2013-07-23: 1 via TOPICAL

## 2013-07-23 SURGICAL SUPPLY — 73 items
BANDAGE ELASTIC 3 VELCRO ST LF (GAUZE/BANDAGES/DRESSINGS) IMPLANT
BANDAGE ELASTIC 4 VELCRO ST LF (GAUZE/BANDAGES/DRESSINGS) IMPLANT
BANDAGE ELASTIC 6 VELCRO ST LF (GAUZE/BANDAGES/DRESSINGS) IMPLANT
BENZOIN TINCTURE PRP APPL 2/3 (GAUZE/BANDAGES/DRESSINGS) ×2 IMPLANT
BLADE DERMATOME SS (BLADE) ×2 IMPLANT
BLADE HEX COATED 2.75 (ELECTRODE) IMPLANT
BLADE SURG 10 STRL SS (BLADE) IMPLANT
BLADE SURG 15 STRL LF DISP TIS (BLADE) ×1 IMPLANT
BLADE SURG 15 STRL SS (BLADE) ×1
BLADE SURG ROTATE 9660 (MISCELLANEOUS) IMPLANT
BNDG GAUZE ELAST 4 BULKY (GAUZE/BANDAGES/DRESSINGS) ×2 IMPLANT
CANISTER SUCT 1200ML W/VALVE (MISCELLANEOUS) IMPLANT
COTTONBALL LRG STERILE PKG (GAUZE/BANDAGES/DRESSINGS) IMPLANT
COVER MAYO STAND STRL (DRAPES) ×2 IMPLANT
COVER TABLE BACK 60X90 (DRAPES) ×2 IMPLANT
DECANTER SPIKE VIAL GLASS SM (MISCELLANEOUS) IMPLANT
DERMABOND ADVANCED (GAUZE/BANDAGES/DRESSINGS)
DERMABOND ADVANCED .7 DNX12 (GAUZE/BANDAGES/DRESSINGS) IMPLANT
DERMACARRIERS GRAFT 1 TO 1.5 (DISPOSABLE) ×2
DRAPE EXTREMITY T 121X128X90 (DRAPE) IMPLANT
DRAPE INCISE IOBAN 66X45 STRL (DRAPES) IMPLANT
DRAPE PED LAPAROTOMY (DRAPES) ×2 IMPLANT
DRAPE SURG 17X23 STRL (DRAPES) IMPLANT
DRAPE U-SHAPE 76X120 STRL (DRAPES) ×2 IMPLANT
DRSG ADAPTIC 3X8 NADH LF (GAUZE/BANDAGES/DRESSINGS) ×2 IMPLANT
DRSG EMULSION OIL 3X3 NADH (GAUZE/BANDAGES/DRESSINGS) IMPLANT
DRSG PAD ABDOMINAL 8X10 ST (GAUZE/BANDAGES/DRESSINGS) ×2 IMPLANT
ELECT COATED BLADE 2.86 ST (ELECTRODE) ×2 IMPLANT
ELECT NEEDLE BLADE 2-5/6 (NEEDLE) IMPLANT
ELECT REM PT RETURN 9FT ADLT (ELECTROSURGICAL)
ELECT REM PT RETURN 9FT PED (ELECTROSURGICAL)
ELECTRODE REM PT RETRN 9FT PED (ELECTROSURGICAL) IMPLANT
ELECTRODE REM PT RTRN 9FT ADLT (ELECTROSURGICAL) IMPLANT
GAUZE SPONGE 4X4 12PLY STRL (GAUZE/BANDAGES/DRESSINGS) IMPLANT
GAUZE XEROFORM 1X8 LF (GAUZE/BANDAGES/DRESSINGS) IMPLANT
GLOVE BIO SURGEON STRL SZ 6 (GLOVE) ×4 IMPLANT
GLOVE BIO SURGEON STRL SZ 6.5 (GLOVE) IMPLANT
GOWN STRL REUS W/ TWL LRG LVL3 (GOWN DISPOSABLE) ×2 IMPLANT
GOWN STRL REUS W/TWL LRG LVL3 (GOWN DISPOSABLE) ×2
GRAFT DERMACARRIERS 1 TO 1.5 (DISPOSABLE) ×1 IMPLANT
MATRIX SURGICAL PSM 7X10CM (Tissue) ×2 IMPLANT
NEEDLE 27GAX1X1/2 (NEEDLE) ×2 IMPLANT
NS IRRIG 1000ML POUR BTL (IV SOLUTION) ×2 IMPLANT
PACK BASIN DAY SURGERY FS (CUSTOM PROCEDURE TRAY) ×2 IMPLANT
PAD CAST 3X4 CTTN HI CHSV (CAST SUPPLIES) IMPLANT
PAD CAST 4YDX4 CTTN HI CHSV (CAST SUPPLIES) IMPLANT
PADDING CAST COTTON 3X4 STRL (CAST SUPPLIES)
PADDING CAST COTTON 4X4 STRL (CAST SUPPLIES)
PENCIL BUTTON HOLSTER BLD 10FT (ELECTRODE) IMPLANT
SHEET MEDIUM DRAPE 40X70 STRL (DRAPES) IMPLANT
SPONGE GAUZE 4X4 12PLY STER LF (GAUZE/BANDAGES/DRESSINGS) IMPLANT
SPONGE LAP 18X18 X RAY DECT (DISPOSABLE) ×2 IMPLANT
STAPLER VISISTAT 35W (STAPLE) ×2 IMPLANT
STOCKINETTE 4X48 STRL (DRAPES) IMPLANT
STOCKINETTE 6  STRL (DRAPES)
STOCKINETTE 6 STRL (DRAPES) IMPLANT
STOCKINETTE IMPERVIOUS LG (DRAPES) IMPLANT
STRIP CLOSURE SKIN 1/2X4 (GAUZE/BANDAGES/DRESSINGS) IMPLANT
SURGILUBE 2OZ TUBE FLIPTOP (MISCELLANEOUS) ×2 IMPLANT
SUT CHROMIC 4 0 PS 2 18 (SUTURE) ×4 IMPLANT
SUT MNCRL AB 4-0 PS2 18 (SUTURE) IMPLANT
SUT MON AB 5-0 P3 18 (SUTURE) IMPLANT
SUT SILK 3 0 SH CR/8 (SUTURE) IMPLANT
SUT SILK 4 0 SH CR/8 (SUTURE) IMPLANT
SUT VIC AB 5-0 P-3 18X BRD (SUTURE) IMPLANT
SUT VIC AB 5-0 P3 18 (SUTURE)
SYR BULB 3OZ (MISCELLANEOUS) IMPLANT
SYRINGE CONTROL L 12CC (SYRINGE) ×2 IMPLANT
TOWEL OR 17X24 6PK STRL BLUE (TOWEL DISPOSABLE) ×2 IMPLANT
TRAY DSU PREP LF (CUSTOM PROCEDURE TRAY) ×2 IMPLANT
TUBE CONNECTING 20X1/4 (TUBING) ×2 IMPLANT
UNDERPAD 30X30 INCONTINENT (UNDERPADS AND DIAPERS) ×2 IMPLANT
YANKAUER SUCT BULB TIP NO VENT (SUCTIONS) ×2 IMPLANT

## 2013-07-23 NOTE — Anesthesia Preprocedure Evaluation (Signed)
Anesthesia Evaluation  Patient identified by MRN, date of birth, ID band Patient awake    Reviewed: Allergy & Precautions, H&P , NPO status , Patient's Chart, lab work & pertinent test results  Airway Mallampati: II TM Distance: >3 FB Neck ROM: Full    Dental  (+) Teeth Intact, Dental Advisory Given   Pulmonary  breath sounds clear to auscultation        Cardiovascular hypertension, Rhythm:Regular Rate:Normal     Neuro/Psych    GI/Hepatic   Endo/Other    Renal/GU      Musculoskeletal   Abdominal   Peds  Hematology   Anesthesia Other Findings   Reproductive/Obstetrics                           Anesthesia Physical Anesthesia Plan  ASA: II  Anesthesia Plan: General   Post-op Pain Management:    Induction: Intravenous  Airway Management Planned: LMA  Additional Equipment:   Intra-op Plan:   Post-operative Plan:   Informed Consent: I have reviewed the patients History and Physical, chart, labs and discussed the procedure including the risks, benefits and alternatives for the proposed anesthesia with the patient or authorized representative who has indicated his/her understanding and acceptance.   Dental advisory given  Plan Discussed with: CRNA and Anesthesiologist  Anesthesia Plan Comments: (Melanoma L, posterior shoulder, S/P excision Hypertension H/O post-op N/V  Plan GA with LMA  Roberts Gaudy, MD)        Anesthesia Quick Evaluation

## 2013-07-23 NOTE — Transfer of Care (Signed)
Immediate Anesthesia Transfer of Care Note  Patient: Mindy Meyer  Procedure(s) Performed: Procedure(s): SKIN GRAFT SPLIT THICKNESS FROM RIGHT OR LEFT THIGH TO VAC/APPLICATION OF ACELL TO THIGH (Left)  Patient Location: PACU  Anesthesia Type:General  Level of Consciousness: awake, oriented and patient cooperative  Airway & Oxygen Therapy: Patient Spontanous Breathing and Patient connected to face mask oxygen  Post-op Assessment: Report given to PACU RN and Post -op Vital signs reviewed and stable  Post vital signs: Reviewed and stable  Complications: No apparent anesthesia complications

## 2013-07-23 NOTE — Op Note (Signed)
Operative Note   DATE OF OPERATION: 7.17.15  LOCATION: [place] DIVISION: Plastic Surgery  PREOPERATIVE DIAGNOSES:  1. Post melanoma resection back 2. Open wound back  POSTOPERATIVE DIAGNOSES:  same  PROCEDURE:  1. Split thickness skin graft to back 26 cm2  2. Application A Cell matrix to left thigh 26 cm2  SURGEON: Irene Limbo MD MBA  ASSISTANT: none  ANESTHESIA:  General.   EBL: minimal  COMPLICATIONS: None.   INDICATIONS FOR PROCEDURE:  The patient, Mindy Meyer, is a 80 y.o. female born on Oct 24, 1925, is here for treatment open wound back post melanoma excision. Pathology with clear margins.    FINDINGS: 5 x 5.2 cm wound 75 % granulated.  DESCRIPTION OF PROCEDURE:  The patient's operative site was marked with the patient in the preoperative area. The patient was taken to the operating room. SCDs were placed and IV antibiotics were given. The patient's operative site was prepped and draped in a sterile fashion. A time out was performed and all information was confirmed to be correct.  Wound irrigated and hemostasis obtained. Split thickness skin graft harvested at 12/998 th inch  and meshed  In 1:1.5 ratio. Inset with 4-0 chromic suture. Adaptic and VAC applied and set to 125 mm Hg continuous. Perforated A Cell applied to left thigh donor site. Dressed with Adaptic, surgical lubricant and dry dressing.   The patient was allowed to wake from anesthesia, extubated and taken to the recovery room in satisfactory condition.   SPECIMENS: none  DRAINS: none  Irene Limbo, MD [facility] Plastic & Reconstructive Surgery [phone]

## 2013-07-23 NOTE — Anesthesia Postprocedure Evaluation (Signed)
  Anesthesia Post-op Note  Patient: Mindy Meyer  Procedure(s) Performed: Procedure(s): SKIN GRAFT SPLIT THICKNESS FROM RIGHT OR LEFT THIGH TO VAC/APPLICATION OF ACELL TO THIGH (Left)  Patient Location: PACU  Anesthesia Type:General  Level of Consciousness: awake, alert  and oriented  Airway and Oxygen Therapy: Patient Spontanous Breathing  Post-op Pain: mild  Post-op Assessment: Post-op Vital signs reviewed, Patient's Cardiovascular Status Stable, Respiratory Function Stable, Patent Airway and Pain level controlled  Post-op Vital Signs: stable  Last Vitals:  Filed Vitals:   07/23/13 0930  BP: 122/34  Pulse: 59  Temp:   Resp: 13    Complications: No apparent anesthesia complications

## 2013-07-23 NOTE — Interval H&P Note (Signed)
History and Physical Interval Note:  07/23/2013 5:56 AM  Mindy Meyer  has presented today for surgery, with the diagnosis of Melanoma of skin, site unspecified  The various methods of treatment have been discussed with the patient and family. After consideration of risks, benefits and other options for treatment, the patient has consented to  Procedure(s): SKIN GRAFT SPLIT THICKNESS FROM RIGHT OR LEFT THIGH TO VAC/APPLICATION OF ACELL TO THIGH (N/A) as a surgical intervention .  The patient's history has been reviewed, patient examined, no change in status, stable for surgery.  I have reviewed the patient's chart and labs.  Questions were answered to the patient's satisfaction.     Tirso Laws

## 2013-07-23 NOTE — Anesthesia Procedure Notes (Signed)
Procedure Name: LMA Insertion Date/Time: 07/23/2013 7:45 AM Performed by: Toula Moos L Pre-anesthesia Checklist: Patient identified, Emergency Drugs available, Suction available, Patient being monitored and Timeout performed Patient Re-evaluated:Patient Re-evaluated prior to inductionOxygen Delivery Method: Circle System Utilized Preoxygenation: Pre-oxygenation with 100% oxygen Intubation Type: IV induction Ventilation: Mask ventilation without difficulty LMA: LMA inserted LMA Size: 4.0 Number of attempts: 1 Airway Equipment and Method: bite block Placement Confirmation: positive ETCO2 and breath sounds checked- equal and bilateral Tube secured with: Tape Dental Injury: Teeth and Oropharynx as per pre-operative assessment

## 2013-07-23 NOTE — Discharge Instructions (Signed)

## 2013-07-26 ENCOUNTER — Encounter (HOSPITAL_BASED_OUTPATIENT_CLINIC_OR_DEPARTMENT_OTHER): Payer: Self-pay | Admitting: Plastic Surgery

## 2014-01-14 ENCOUNTER — Other Ambulatory Visit: Payer: Self-pay | Admitting: Dermatology

## 2014-05-20 ENCOUNTER — Other Ambulatory Visit: Payer: Self-pay

## 2014-05-20 DIAGNOSIS — Z1231 Encounter for screening mammogram for malignant neoplasm of breast: Secondary | ICD-10-CM

## 2014-06-17 ENCOUNTER — Ambulatory Visit
Admission: RE | Admit: 2014-06-17 | Discharge: 2014-06-17 | Disposition: A | Discharge status: Home / Self Care | Payer: Medicare Other | Source: Ambulatory Visit

## 2014-06-17 DIAGNOSIS — Z1231 Encounter for screening mammogram for malignant neoplasm of breast: Secondary | ICD-10-CM

## 2015-04-04 ENCOUNTER — Other Ambulatory Visit: Payer: Self-pay

## 2015-04-04 DIAGNOSIS — Z1231 Encounter for screening mammogram for malignant neoplasm of breast: Secondary | ICD-10-CM

## 2015-06-21 ENCOUNTER — Ambulatory Visit
Admission: RE | Admit: 2015-06-21 | Discharge: 2015-06-21 | Disposition: A | Discharge status: Home / Self Care | Payer: Medicare Other | Source: Ambulatory Visit

## 2015-06-21 ENCOUNTER — Other Ambulatory Visit: Payer: Self-pay | Admitting: Internal Medicine

## 2015-06-21 DIAGNOSIS — Z1231 Encounter for screening mammogram for malignant neoplasm of breast: Secondary | ICD-10-CM

## 2016-05-16 ENCOUNTER — Other Ambulatory Visit: Payer: Self-pay | Admitting: Internal Medicine

## 2016-05-16 DIAGNOSIS — Z1231 Encounter for screening mammogram for malignant neoplasm of breast: Secondary | ICD-10-CM

## 2016-06-24 ENCOUNTER — Ambulatory Visit
Admission: RE | Admit: 2016-06-24 | Discharge: 2016-06-24 | Disposition: A | Discharge status: Home / Self Care | Payer: Medicare Other | Source: Ambulatory Visit | Attending: Internal Medicine | Admitting: Internal Medicine

## 2016-06-24 DIAGNOSIS — Z1231 Encounter for screening mammogram for malignant neoplasm of breast: Secondary | ICD-10-CM

## 2017-06-09 ENCOUNTER — Other Ambulatory Visit: Payer: Self-pay | Admitting: Internal Medicine

## 2017-06-09 DIAGNOSIS — Z1231 Encounter for screening mammogram for malignant neoplasm of breast: Secondary | ICD-10-CM

## 2017-07-02 ENCOUNTER — Ambulatory Visit
Admission: RE | Admit: 2017-07-02 | Discharge: 2017-07-02 | Disposition: A | Discharge status: Home / Self Care | Payer: Medicare Other | Source: Ambulatory Visit | Attending: Internal Medicine | Admitting: Internal Medicine

## 2017-07-02 DIAGNOSIS — Z1231 Encounter for screening mammogram for malignant neoplasm of breast: Secondary | ICD-10-CM

## 2017-07-02 HISTORY — DX: Malignant neoplasm of unspecified site of unspecified female breast: C50.919

## 2018-11-10 ENCOUNTER — Other Ambulatory Visit: Payer: Self-pay | Admitting: Internal Medicine

## 2018-11-10 DIAGNOSIS — Z1231 Encounter for screening mammogram for malignant neoplasm of breast: Secondary | ICD-10-CM

## 2019-01-04 ENCOUNTER — Ambulatory Visit
Admission: RE | Admit: 2019-01-04 | Discharge: 2019-01-04 | Disposition: A | Discharge status: Home / Self Care | Payer: Medicare Other | Source: Ambulatory Visit | Attending: Internal Medicine | Admitting: Internal Medicine

## 2019-01-04 ENCOUNTER — Other Ambulatory Visit: Payer: Self-pay

## 2019-01-04 DIAGNOSIS — Z1231 Encounter for screening mammogram for malignant neoplasm of breast: Secondary | ICD-10-CM

## 2020-04-12 DIAGNOSIS — E860 Dehydration: Secondary | ICD-10-CM | POA: Diagnosis not present

## 2020-11-29 IMAGING — MG DIGITAL SCREENING UNILAT LEFT W/ TOMO W/ CAD
6 series · 6 of 18 positions shown · non-contrast
Comparison: Previous exam(s).

CLINICAL DATA: Screening.

EXAM:
DIGITAL SCREENING UNILATERAL LEFT MAMMOGRAM WITH CAD AND TOMO

[L MLO synth-2D (1 of 2)]
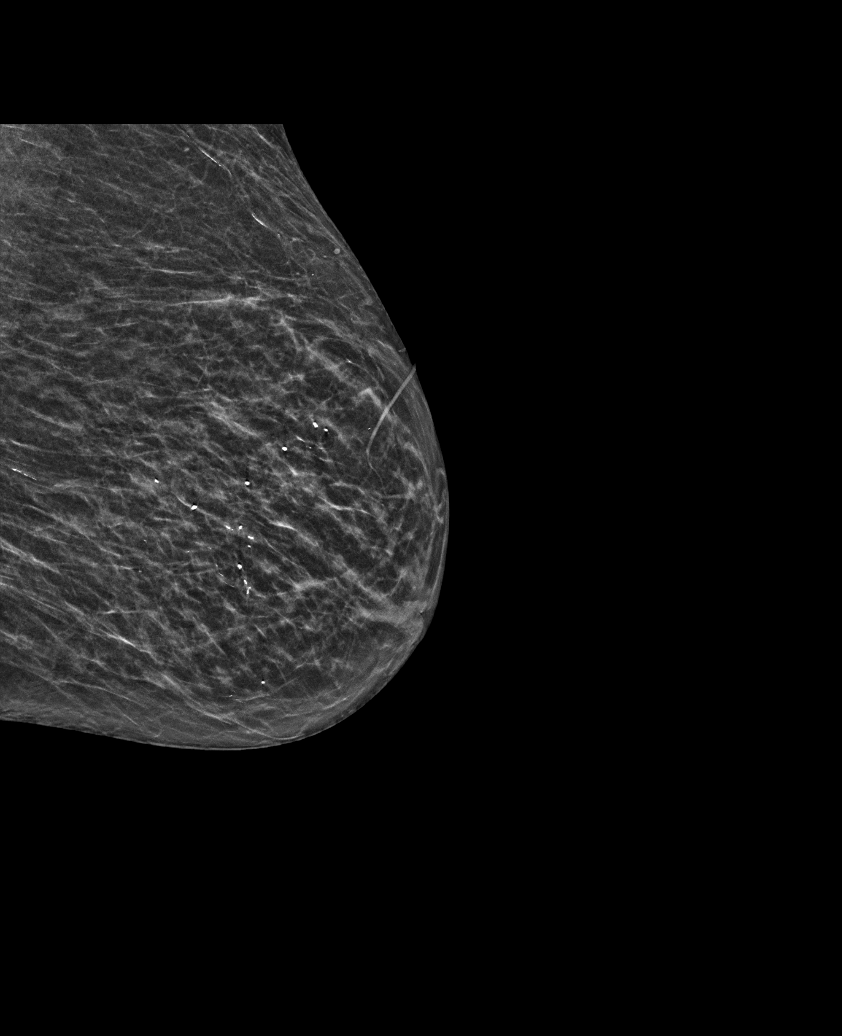

[L CC synth-2D]
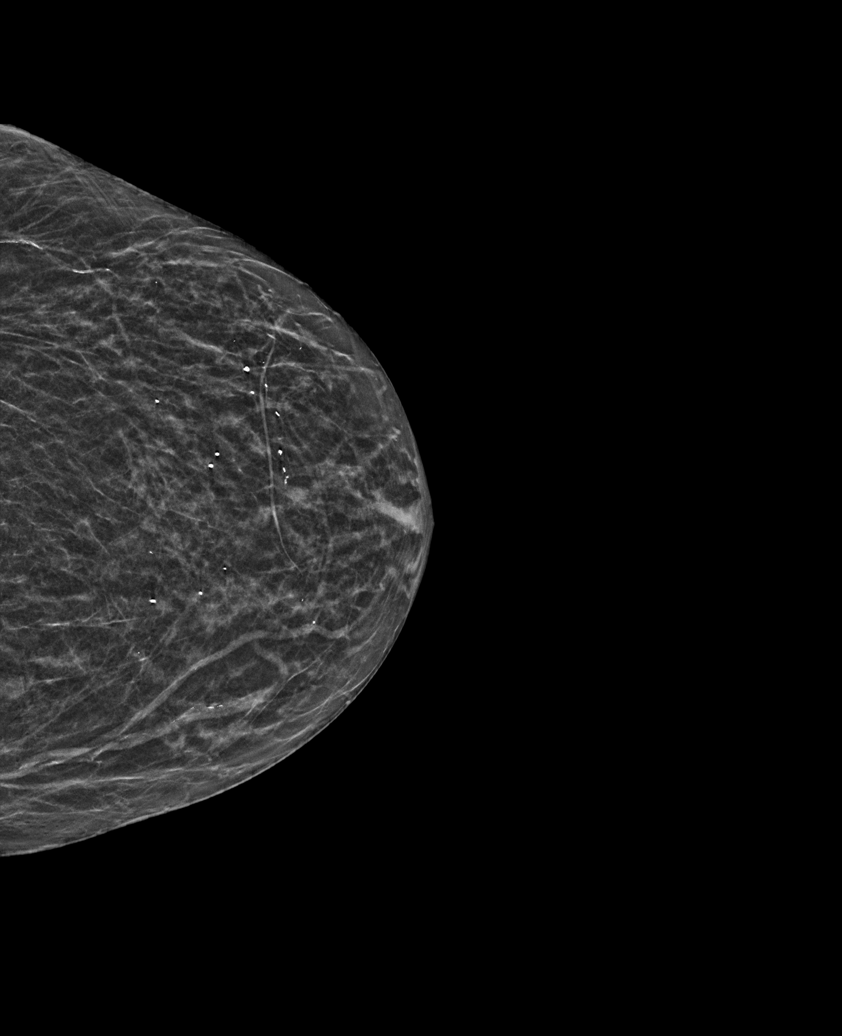

[L MLO synth-2D (2 of 2)]
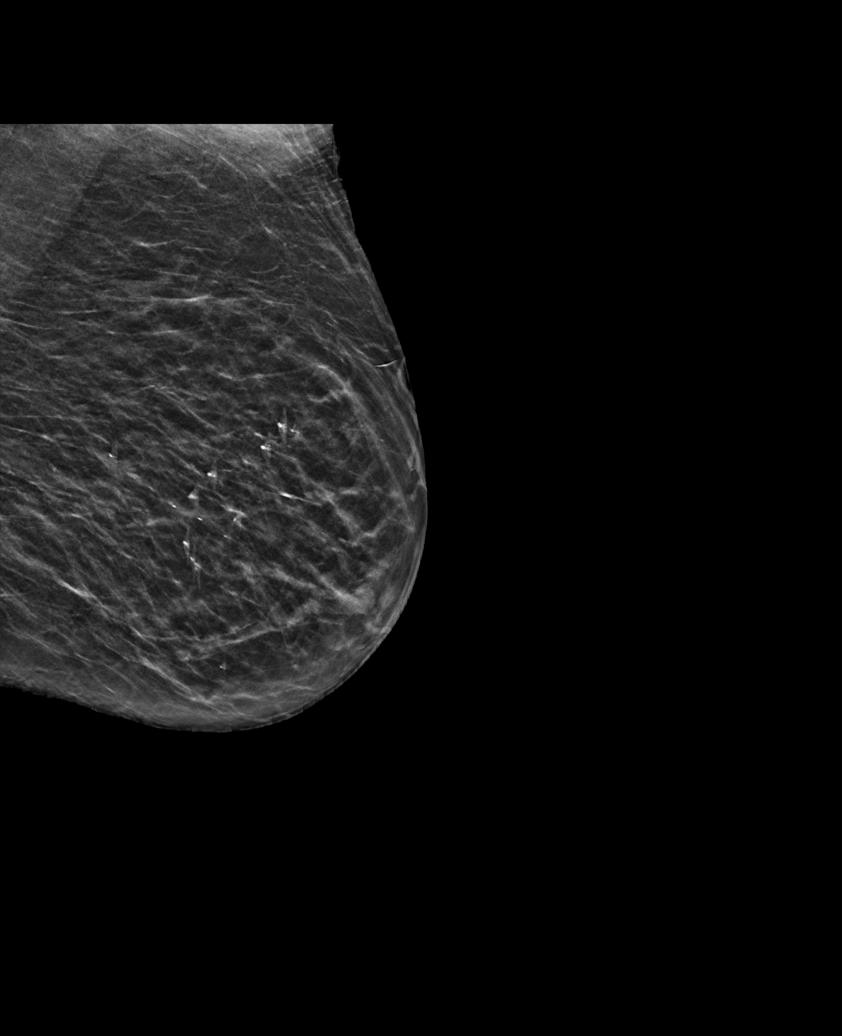

[L CC tomo · tomo slice 21/41.0]
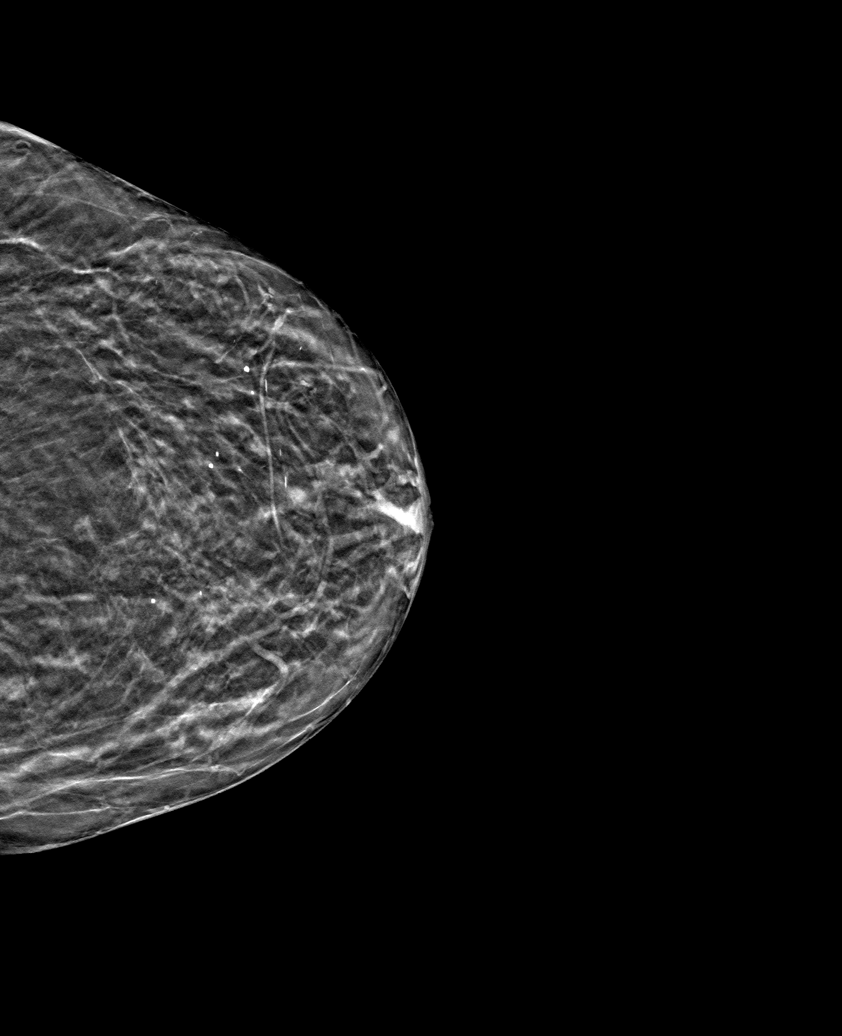

[L MLO tomo (1 of 2) · tomo slice 23/46.0]
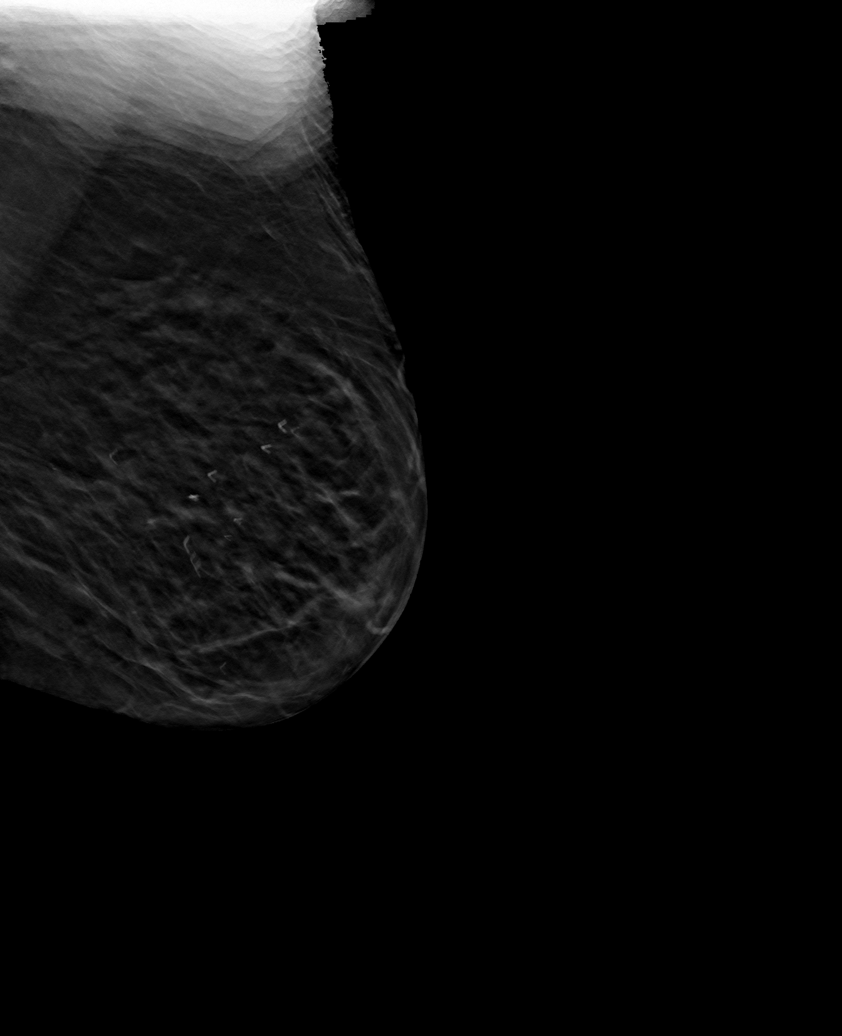

[L MLO tomo (2 of 2) · tomo slice 21/41.0]
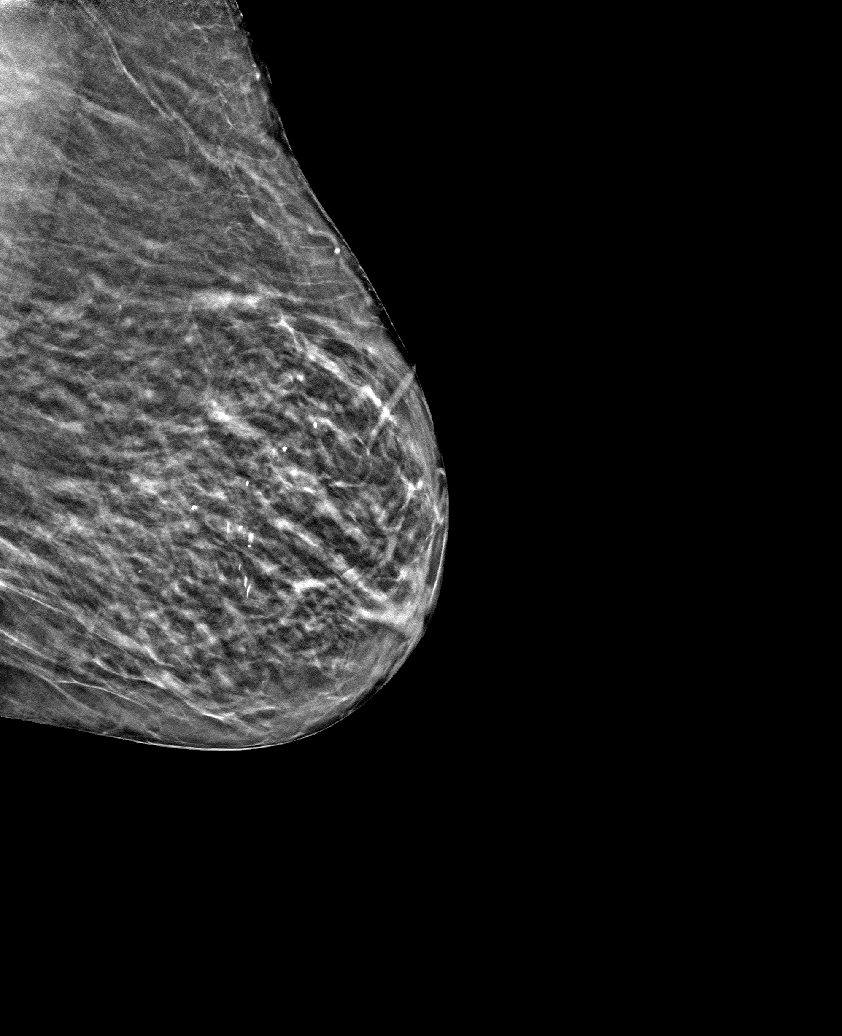

[6 of 18 positions shown; findings below may reference images not displayed]

ACR Breast Density Category b: There are scattered areas of
fibroglandular density.
FINDINGS: The patient has had a right mastectomy. There are no findings
suspicious for malignancy.

Images were processed with CAD.
IMPRESSION: No mammographic evidence of malignancy. A result letter of this
screening mammogram will be mailed directly to the patient.

RECOMMENDATION:
Screening mammogram in one year.  (Code:GY-Q-70Y)

BI-RADS CATEGORY  1: Negative.

## 2020-12-13 DIAGNOSIS — F419 Anxiety disorder, unspecified: Secondary | ICD-10-CM | POA: Diagnosis not present

## 2020-12-13 DIAGNOSIS — F03918 Unspecified dementia, unspecified severity, with other behavioral disturbance: Secondary | ICD-10-CM | POA: Diagnosis not present

## 2020-12-13 DIAGNOSIS — F29 Unspecified psychosis not due to a substance or known physiological condition: Secondary | ICD-10-CM | POA: Diagnosis not present

## 2020-12-20 DIAGNOSIS — S0083XA Contusion of other part of head, initial encounter: Secondary | ICD-10-CM | POA: Diagnosis not present

## 2020-12-20 DIAGNOSIS — R600 Localized edema: Secondary | ICD-10-CM | POA: Diagnosis not present

## 2020-12-20 DIAGNOSIS — S99912A Unspecified injury of left ankle, initial encounter: Secondary | ICD-10-CM | POA: Diagnosis not present

## 2020-12-20 DIAGNOSIS — M542 Cervicalgia: Secondary | ICD-10-CM | POA: Diagnosis not present

## 2020-12-20 DIAGNOSIS — M47812 Spondylosis without myelopathy or radiculopathy, cervical region: Secondary | ICD-10-CM | POA: Diagnosis not present

## 2020-12-20 DIAGNOSIS — Q278 Other specified congenital malformations of peripheral vascular system: Secondary | ICD-10-CM | POA: Diagnosis not present

## 2020-12-20 DIAGNOSIS — M545 Low back pain, unspecified: Secondary | ICD-10-CM | POA: Diagnosis not present

## 2020-12-20 DIAGNOSIS — S93412A Sprain of calcaneofibular ligament of left ankle, initial encounter: Secondary | ICD-10-CM | POA: Diagnosis not present

## 2020-12-20 DIAGNOSIS — M6281 Muscle weakness (generalized): Secondary | ICD-10-CM | POA: Diagnosis not present

## 2020-12-20 DIAGNOSIS — S0990XA Unspecified injury of head, initial encounter: Secondary | ICD-10-CM | POA: Diagnosis not present

## 2020-12-20 DIAGNOSIS — G319 Degenerative disease of nervous system, unspecified: Secondary | ICD-10-CM | POA: Diagnosis not present

## 2020-12-20 DIAGNOSIS — S93402A Sprain of unspecified ligament of left ankle, initial encounter: Secondary | ICD-10-CM | POA: Diagnosis not present

## 2020-12-20 DIAGNOSIS — Z043 Encounter for examination and observation following other accident: Secondary | ICD-10-CM | POA: Diagnosis not present

## 2020-12-20 DIAGNOSIS — M549 Dorsalgia, unspecified: Secondary | ICD-10-CM | POA: Diagnosis not present

## 2020-12-20 DIAGNOSIS — I700 Atherosclerosis of aorta: Secondary | ICD-10-CM | POA: Diagnosis not present

## 2020-12-20 DIAGNOSIS — S0093XA Contusion of unspecified part of head, initial encounter: Secondary | ICD-10-CM | POA: Diagnosis not present

## 2020-12-20 DIAGNOSIS — S199XXA Unspecified injury of neck, initial encounter: Secondary | ICD-10-CM | POA: Diagnosis not present

## 2020-12-20 DIAGNOSIS — J439 Emphysema, unspecified: Secondary | ICD-10-CM | POA: Diagnosis not present

## 2020-12-20 DIAGNOSIS — M79673 Pain in unspecified foot: Secondary | ICD-10-CM | POA: Diagnosis not present

## 2020-12-20 DIAGNOSIS — M79672 Pain in left foot: Secondary | ICD-10-CM | POA: Diagnosis not present

## 2020-12-20 DIAGNOSIS — R269 Unspecified abnormalities of gait and mobility: Secondary | ICD-10-CM | POA: Diagnosis not present

## 2020-12-20 DIAGNOSIS — I10 Essential (primary) hypertension: Secondary | ICD-10-CM | POA: Diagnosis not present

## 2020-12-20 DIAGNOSIS — W19XXXA Unspecified fall, initial encounter: Secondary | ICD-10-CM | POA: Diagnosis not present

## 2020-12-20 DIAGNOSIS — S9032XA Contusion of left foot, initial encounter: Secondary | ICD-10-CM | POA: Diagnosis not present

## 2020-12-21 DIAGNOSIS — N390 Urinary tract infection, site not specified: Secondary | ICD-10-CM | POA: Diagnosis not present

## 2020-12-22 DIAGNOSIS — F419 Anxiety disorder, unspecified: Secondary | ICD-10-CM | POA: Diagnosis not present

## 2020-12-22 DIAGNOSIS — R451 Restlessness and agitation: Secondary | ICD-10-CM | POA: Diagnosis not present

## 2020-12-22 DIAGNOSIS — F03918 Unspecified dementia, unspecified severity, with other behavioral disturbance: Secondary | ICD-10-CM | POA: Diagnosis not present

## 2021-02-12 DIAGNOSIS — N390 Urinary tract infection, site not specified: Secondary | ICD-10-CM | POA: Diagnosis not present

## 2021-02-13 DIAGNOSIS — N390 Urinary tract infection, site not specified: Secondary | ICD-10-CM | POA: Diagnosis not present

## 2021-02-21 DIAGNOSIS — N390 Urinary tract infection, site not specified: Secondary | ICD-10-CM | POA: Diagnosis not present

## 2021-03-01 DIAGNOSIS — F419 Anxiety disorder, unspecified: Secondary | ICD-10-CM | POA: Diagnosis not present

## 2021-03-09 DIAGNOSIS — M25561 Pain in right knee: Secondary | ICD-10-CM | POA: Diagnosis not present

## 2021-03-09 DIAGNOSIS — M1611 Unilateral primary osteoarthritis, right hip: Secondary | ICD-10-CM | POA: Diagnosis not present

## 2021-03-09 DIAGNOSIS — I10 Essential (primary) hypertension: Secondary | ICD-10-CM | POA: Diagnosis not present

## 2021-03-09 DIAGNOSIS — M7989 Other specified soft tissue disorders: Secondary | ICD-10-CM | POA: Diagnosis not present

## 2021-03-09 DIAGNOSIS — S8001XA Contusion of right knee, initial encounter: Secondary | ICD-10-CM | POA: Diagnosis not present

## 2021-03-09 DIAGNOSIS — Z043 Encounter for examination and observation following other accident: Secondary | ICD-10-CM | POA: Diagnosis not present

## 2021-03-09 DIAGNOSIS — Z7401 Bed confinement status: Secondary | ICD-10-CM | POA: Diagnosis not present

## 2021-03-09 DIAGNOSIS — W19XXXA Unspecified fall, initial encounter: Secondary | ICD-10-CM | POA: Diagnosis not present

## 2021-03-09 DIAGNOSIS — R8271 Bacteriuria: Secondary | ICD-10-CM | POA: Diagnosis not present

## 2021-03-09 DIAGNOSIS — T07XXXA Unspecified multiple injuries, initial encounter: Secondary | ICD-10-CM | POA: Diagnosis not present

## 2021-03-12 DIAGNOSIS — H353221 Exudative age-related macular degeneration, left eye, with active choroidal neovascularization: Secondary | ICD-10-CM | POA: Diagnosis not present

## 2021-03-12 DIAGNOSIS — H353110 Nonexudative age-related macular degeneration, right eye, stage unspecified: Secondary | ICD-10-CM | POA: Diagnosis not present

## 2021-03-19 DIAGNOSIS — Z79899 Other long term (current) drug therapy: Secondary | ICD-10-CM | POA: Diagnosis not present

## 2021-03-19 DIAGNOSIS — M6281 Muscle weakness (generalized): Secondary | ICD-10-CM | POA: Diagnosis not present

## 2021-03-19 DIAGNOSIS — Z130 Encounter for screening for diseases of the blood and blood-forming organs and certain disorders involving the immune mechanism: Secondary | ICD-10-CM | POA: Diagnosis not present

## 2021-03-19 DIAGNOSIS — Z1329 Encounter for screening for other suspected endocrine disorder: Secondary | ICD-10-CM | POA: Diagnosis not present

## 2021-03-19 DIAGNOSIS — I10 Essential (primary) hypertension: Secondary | ICD-10-CM | POA: Diagnosis not present

## 2021-03-19 DIAGNOSIS — E119 Type 2 diabetes mellitus without complications: Secondary | ICD-10-CM | POA: Diagnosis not present

## 2021-03-22 DIAGNOSIS — F29 Unspecified psychosis not due to a substance or known physiological condition: Secondary | ICD-10-CM | POA: Diagnosis not present

## 2021-03-22 DIAGNOSIS — F419 Anxiety disorder, unspecified: Secondary | ICD-10-CM | POA: Diagnosis not present

## 2021-05-06 DIAGNOSIS — R531 Weakness: Secondary | ICD-10-CM | POA: Diagnosis not present

## 2021-05-07 DIAGNOSIS — R4182 Altered mental status, unspecified: Secondary | ICD-10-CM | POA: Diagnosis not present

## 2021-05-07 DIAGNOSIS — Z743 Need for continuous supervision: Secondary | ICD-10-CM | POA: Diagnosis not present

## 2021-05-07 DIAGNOSIS — F0390 Unspecified dementia without behavioral disturbance: Secondary | ICD-10-CM | POA: Diagnosis not present

## 2021-05-07 DIAGNOSIS — E860 Dehydration: Secondary | ICD-10-CM | POA: Diagnosis not present

## 2021-05-07 DIAGNOSIS — R410 Disorientation, unspecified: Secondary | ICD-10-CM | POA: Diagnosis not present

## 2021-05-07 DIAGNOSIS — R279 Unspecified lack of coordination: Secondary | ICD-10-CM | POA: Diagnosis not present

## 2021-05-14 DIAGNOSIS — N390 Urinary tract infection, site not specified: Secondary | ICD-10-CM | POA: Diagnosis not present

## 2021-05-18 DIAGNOSIS — M1711 Unilateral primary osteoarthritis, right knee: Secondary | ICD-10-CM | POA: Diagnosis not present

## 2021-05-18 DIAGNOSIS — Z743 Need for continuous supervision: Secondary | ICD-10-CM | POA: Diagnosis not present

## 2021-05-18 DIAGNOSIS — M6250 Muscle wasting and atrophy, not elsewhere classified, unspecified site: Secondary | ICD-10-CM | POA: Diagnosis not present

## 2021-05-18 DIAGNOSIS — W19XXXA Unspecified fall, initial encounter: Secondary | ICD-10-CM | POA: Diagnosis not present

## 2021-05-18 DIAGNOSIS — M1611 Unilateral primary osteoarthritis, right hip: Secondary | ICD-10-CM | POA: Diagnosis not present

## 2021-05-18 DIAGNOSIS — M25561 Pain in right knee: Secondary | ICD-10-CM | POA: Diagnosis not present

## 2021-05-18 DIAGNOSIS — I959 Hypotension, unspecified: Secondary | ICD-10-CM | POA: Diagnosis not present

## 2021-05-18 DIAGNOSIS — Z043 Encounter for examination and observation following other accident: Secondary | ICD-10-CM | POA: Diagnosis not present

## 2021-05-18 DIAGNOSIS — M1990 Unspecified osteoarthritis, unspecified site: Secondary | ICD-10-CM | POA: Diagnosis not present

## 2021-07-02 DIAGNOSIS — R296 Repeated falls: Secondary | ICD-10-CM | POA: Diagnosis not present

## 2021-07-02 DIAGNOSIS — R278 Other lack of coordination: Secondary | ICD-10-CM | POA: Diagnosis not present

## 2021-07-02 DIAGNOSIS — M6281 Muscle weakness (generalized): Secondary | ICD-10-CM | POA: Diagnosis not present

## 2021-07-04 DIAGNOSIS — M1990 Unspecified osteoarthritis, unspecified site: Secondary | ICD-10-CM | POA: Diagnosis not present

## 2021-07-04 DIAGNOSIS — R41841 Cognitive communication deficit: Secondary | ICD-10-CM | POA: Diagnosis not present

## 2021-07-04 DIAGNOSIS — F0390 Unspecified dementia without behavioral disturbance: Secondary | ICD-10-CM | POA: Diagnosis not present

## 2021-07-04 DIAGNOSIS — I10 Essential (primary) hypertension: Secondary | ICD-10-CM | POA: Diagnosis not present

## 2021-07-04 DIAGNOSIS — C50019 Malignant neoplasm of nipple and areola, unspecified female breast: Secondary | ICD-10-CM | POA: Diagnosis not present

## 2021-07-10 DIAGNOSIS — R278 Other lack of coordination: Secondary | ICD-10-CM | POA: Diagnosis not present

## 2021-07-10 DIAGNOSIS — R296 Repeated falls: Secondary | ICD-10-CM | POA: Diagnosis not present

## 2021-07-10 DIAGNOSIS — M6281 Muscle weakness (generalized): Secondary | ICD-10-CM | POA: Diagnosis not present

## 2021-07-12 DIAGNOSIS — R296 Repeated falls: Secondary | ICD-10-CM | POA: Diagnosis not present

## 2021-07-12 DIAGNOSIS — R278 Other lack of coordination: Secondary | ICD-10-CM | POA: Diagnosis not present

## 2021-07-12 DIAGNOSIS — M6281 Muscle weakness (generalized): Secondary | ICD-10-CM | POA: Diagnosis not present

## 2021-07-17 DIAGNOSIS — R278 Other lack of coordination: Secondary | ICD-10-CM | POA: Diagnosis not present

## 2021-07-17 DIAGNOSIS — R296 Repeated falls: Secondary | ICD-10-CM | POA: Diagnosis not present

## 2021-07-17 DIAGNOSIS — M6281 Muscle weakness (generalized): Secondary | ICD-10-CM | POA: Diagnosis not present

## 2021-07-20 DIAGNOSIS — R296 Repeated falls: Secondary | ICD-10-CM | POA: Diagnosis not present

## 2021-07-20 DIAGNOSIS — M6281 Muscle weakness (generalized): Secondary | ICD-10-CM | POA: Diagnosis not present

## 2021-07-20 DIAGNOSIS — R278 Other lack of coordination: Secondary | ICD-10-CM | POA: Diagnosis not present

## 2021-07-24 DIAGNOSIS — M6281 Muscle weakness (generalized): Secondary | ICD-10-CM | POA: Diagnosis not present

## 2021-07-24 DIAGNOSIS — R278 Other lack of coordination: Secondary | ICD-10-CM | POA: Diagnosis not present

## 2021-07-24 DIAGNOSIS — R296 Repeated falls: Secondary | ICD-10-CM | POA: Diagnosis not present

## 2021-07-26 DIAGNOSIS — R296 Repeated falls: Secondary | ICD-10-CM | POA: Diagnosis not present

## 2021-07-26 DIAGNOSIS — R278 Other lack of coordination: Secondary | ICD-10-CM | POA: Diagnosis not present

## 2021-07-26 DIAGNOSIS — M6281 Muscle weakness (generalized): Secondary | ICD-10-CM | POA: Diagnosis not present

## 2021-07-30 DIAGNOSIS — M6281 Muscle weakness (generalized): Secondary | ICD-10-CM | POA: Diagnosis not present

## 2021-07-30 DIAGNOSIS — R278 Other lack of coordination: Secondary | ICD-10-CM | POA: Diagnosis not present

## 2021-07-30 DIAGNOSIS — R296 Repeated falls: Secondary | ICD-10-CM | POA: Diagnosis not present

## 2021-08-03 DIAGNOSIS — M6281 Muscle weakness (generalized): Secondary | ICD-10-CM | POA: Diagnosis not present

## 2021-08-03 DIAGNOSIS — R296 Repeated falls: Secondary | ICD-10-CM | POA: Diagnosis not present

## 2021-08-03 DIAGNOSIS — R278 Other lack of coordination: Secondary | ICD-10-CM | POA: Diagnosis not present

## 2021-08-06 DIAGNOSIS — M6281 Muscle weakness (generalized): Secondary | ICD-10-CM | POA: Diagnosis not present

## 2021-08-06 DIAGNOSIS — R296 Repeated falls: Secondary | ICD-10-CM | POA: Diagnosis not present

## 2021-08-06 DIAGNOSIS — R278 Other lack of coordination: Secondary | ICD-10-CM | POA: Diagnosis not present

## 2021-08-09 DIAGNOSIS — M6281 Muscle weakness (generalized): Secondary | ICD-10-CM | POA: Diagnosis not present

## 2021-08-09 DIAGNOSIS — R296 Repeated falls: Secondary | ICD-10-CM | POA: Diagnosis not present

## 2021-08-09 DIAGNOSIS — R278 Other lack of coordination: Secondary | ICD-10-CM | POA: Diagnosis not present

## 2021-08-14 DIAGNOSIS — M6281 Muscle weakness (generalized): Secondary | ICD-10-CM | POA: Diagnosis not present

## 2021-08-14 DIAGNOSIS — R296 Repeated falls: Secondary | ICD-10-CM | POA: Diagnosis not present

## 2021-08-14 DIAGNOSIS — R278 Other lack of coordination: Secondary | ICD-10-CM | POA: Diagnosis not present

## 2021-08-15 DIAGNOSIS — R278 Other lack of coordination: Secondary | ICD-10-CM | POA: Diagnosis not present

## 2021-08-15 DIAGNOSIS — R296 Repeated falls: Secondary | ICD-10-CM | POA: Diagnosis not present

## 2021-08-15 DIAGNOSIS — M6281 Muscle weakness (generalized): Secondary | ICD-10-CM | POA: Diagnosis not present

## 2021-08-21 DIAGNOSIS — M6281 Muscle weakness (generalized): Secondary | ICD-10-CM | POA: Diagnosis not present

## 2021-08-21 DIAGNOSIS — R296 Repeated falls: Secondary | ICD-10-CM | POA: Diagnosis not present

## 2021-08-21 DIAGNOSIS — I10 Essential (primary) hypertension: Secondary | ICD-10-CM | POA: Diagnosis not present

## 2021-08-21 DIAGNOSIS — E119 Type 2 diabetes mellitus without complications: Secondary | ICD-10-CM | POA: Diagnosis not present

## 2021-08-21 DIAGNOSIS — Z130 Encounter for screening for diseases of the blood and blood-forming organs and certain disorders involving the immune mechanism: Secondary | ICD-10-CM | POA: Diagnosis not present

## 2021-08-21 DIAGNOSIS — E559 Vitamin D deficiency, unspecified: Secondary | ICD-10-CM | POA: Diagnosis not present

## 2021-08-21 DIAGNOSIS — Z79899 Other long term (current) drug therapy: Secondary | ICD-10-CM | POA: Diagnosis not present

## 2021-08-21 DIAGNOSIS — Z1329 Encounter for screening for other suspected endocrine disorder: Secondary | ICD-10-CM | POA: Diagnosis not present

## 2021-08-21 DIAGNOSIS — R278 Other lack of coordination: Secondary | ICD-10-CM | POA: Diagnosis not present

## 2021-08-23 DIAGNOSIS — R278 Other lack of coordination: Secondary | ICD-10-CM | POA: Diagnosis not present

## 2021-08-23 DIAGNOSIS — R296 Repeated falls: Secondary | ICD-10-CM | POA: Diagnosis not present

## 2021-08-23 DIAGNOSIS — M6281 Muscle weakness (generalized): Secondary | ICD-10-CM | POA: Diagnosis not present

## 2021-08-27 DIAGNOSIS — M6281 Muscle weakness (generalized): Secondary | ICD-10-CM | POA: Diagnosis not present

## 2021-08-27 DIAGNOSIS — R296 Repeated falls: Secondary | ICD-10-CM | POA: Diagnosis not present

## 2021-08-27 DIAGNOSIS — R278 Other lack of coordination: Secondary | ICD-10-CM | POA: Diagnosis not present

## 2021-08-29 DIAGNOSIS — R296 Repeated falls: Secondary | ICD-10-CM | POA: Diagnosis not present

## 2021-08-29 DIAGNOSIS — M6281 Muscle weakness (generalized): Secondary | ICD-10-CM | POA: Diagnosis not present

## 2021-08-29 DIAGNOSIS — R278 Other lack of coordination: Secondary | ICD-10-CM | POA: Diagnosis not present

## 2021-09-01 DIAGNOSIS — R0781 Pleurodynia: Secondary | ICD-10-CM | POA: Diagnosis not present

## 2021-09-01 DIAGNOSIS — M25531 Pain in right wrist: Secondary | ICD-10-CM | POA: Diagnosis not present

## 2021-09-01 DIAGNOSIS — W19XXXA Unspecified fall, initial encounter: Secondary | ICD-10-CM | POA: Diagnosis not present

## 2021-09-04 DIAGNOSIS — H9191 Unspecified hearing loss, right ear: Secondary | ICD-10-CM | POA: Diagnosis not present

## 2021-09-04 DIAGNOSIS — H6121 Impacted cerumen, right ear: Secondary | ICD-10-CM | POA: Diagnosis not present

## 2021-09-04 DIAGNOSIS — H61303 Acquired stenosis of external ear canal, unspecified, bilateral: Secondary | ICD-10-CM | POA: Diagnosis not present

## 2021-09-05 DIAGNOSIS — R296 Repeated falls: Secondary | ICD-10-CM | POA: Diagnosis not present

## 2021-09-05 DIAGNOSIS — M6281 Muscle weakness (generalized): Secondary | ICD-10-CM | POA: Diagnosis not present

## 2021-09-05 DIAGNOSIS — R278 Other lack of coordination: Secondary | ICD-10-CM | POA: Diagnosis not present

## 2021-09-06 DIAGNOSIS — M6281 Muscle weakness (generalized): Secondary | ICD-10-CM | POA: Diagnosis not present

## 2021-09-06 DIAGNOSIS — R296 Repeated falls: Secondary | ICD-10-CM | POA: Diagnosis not present

## 2021-09-06 DIAGNOSIS — R278 Other lack of coordination: Secondary | ICD-10-CM | POA: Diagnosis not present

## 2021-09-12 DIAGNOSIS — M6281 Muscle weakness (generalized): Secondary | ICD-10-CM | POA: Diagnosis not present

## 2021-09-12 DIAGNOSIS — R296 Repeated falls: Secondary | ICD-10-CM | POA: Diagnosis not present

## 2021-09-12 DIAGNOSIS — R278 Other lack of coordination: Secondary | ICD-10-CM | POA: Diagnosis not present

## 2021-09-14 DIAGNOSIS — R278 Other lack of coordination: Secondary | ICD-10-CM | POA: Diagnosis not present

## 2021-09-14 DIAGNOSIS — M6281 Muscle weakness (generalized): Secondary | ICD-10-CM | POA: Diagnosis not present

## 2021-09-14 DIAGNOSIS — R296 Repeated falls: Secondary | ICD-10-CM | POA: Diagnosis not present

## 2021-09-18 DIAGNOSIS — M6281 Muscle weakness (generalized): Secondary | ICD-10-CM | POA: Diagnosis not present

## 2021-09-18 DIAGNOSIS — R278 Other lack of coordination: Secondary | ICD-10-CM | POA: Diagnosis not present

## 2021-09-18 DIAGNOSIS — R296 Repeated falls: Secondary | ICD-10-CM | POA: Diagnosis not present

## 2021-09-20 DIAGNOSIS — R296 Repeated falls: Secondary | ICD-10-CM | POA: Diagnosis not present

## 2021-09-20 DIAGNOSIS — M6281 Muscle weakness (generalized): Secondary | ICD-10-CM | POA: Diagnosis not present

## 2021-09-20 DIAGNOSIS — R278 Other lack of coordination: Secondary | ICD-10-CM | POA: Diagnosis not present

## 2021-09-24 DIAGNOSIS — M6281 Muscle weakness (generalized): Secondary | ICD-10-CM | POA: Diagnosis not present

## 2021-09-24 DIAGNOSIS — R296 Repeated falls: Secondary | ICD-10-CM | POA: Diagnosis not present

## 2021-09-24 DIAGNOSIS — R278 Other lack of coordination: Secondary | ICD-10-CM | POA: Diagnosis not present

## 2021-10-10 DIAGNOSIS — M79652 Pain in left thigh: Secondary | ICD-10-CM | POA: Diagnosis not present

## 2021-10-10 DIAGNOSIS — S7002XA Contusion of left hip, initial encounter: Secondary | ICD-10-CM | POA: Diagnosis not present

## 2021-10-10 DIAGNOSIS — M25552 Pain in left hip: Secondary | ICD-10-CM | POA: Diagnosis not present

## 2021-10-10 DIAGNOSIS — S7012XA Contusion of left thigh, initial encounter: Secondary | ICD-10-CM | POA: Diagnosis not present

## 2021-10-11 DIAGNOSIS — Z23 Encounter for immunization: Secondary | ICD-10-CM | POA: Diagnosis not present

## 2021-11-05 DIAGNOSIS — N390 Urinary tract infection, site not specified: Secondary | ICD-10-CM | POA: Diagnosis not present

## 2022-01-04 ENCOUNTER — Telehealth: Payer: Self-pay

## 2022-01-04 NOTE — Telephone Encounter (Signed)
        Patient  visited [facility] on 12/29/2021  for treatment.   Telephone encounter attempt :  1st  A HIPAA compliant voice message was left requesting a return call.  Instructed patient to call back at [phone].   [place] Resource Care Guide   [email or web address]  ?? [phone]   Website: [email or web address]  [email or web address]

## 2022-01-09 ENCOUNTER — Telehealth: Payer: Self-pay

## 2022-01-09 NOTE — Telephone Encounter (Signed)
        Patient  visited [facility] on 12/29/2021  for treatment.   Telephone encounter attempt : 2nd   A HIPAA compliant voice message was left requesting a return call.  Instructed patient to call back at [phone].   [employer] Resource Care Guide   [email or web address]  ?? [phone]   Website: [email or web address]  [email or web address]

## 2022-01-10 ENCOUNTER — Telehealth: Payer: Self-pay

## 2022-01-10 NOTE — Telephone Encounter (Signed)
        Patient  visited [facility] on 12/29/2021  for treatment.   Telephone encounter attempt :  3rd  A HIPAA compliant voice message was left requesting a return call.  Instructed patient to call back at [phone].   [place] Resource Care Guide   [email or web address]  ?? [phone]   Website: [email or web address]

## 2022-01-11 DIAGNOSIS — F0390 Unspecified dementia without behavioral disturbance: Secondary | ICD-10-CM

## 2022-01-11 DIAGNOSIS — M6281 Muscle weakness (generalized): Secondary | ICD-10-CM | POA: Diagnosis not present

## 2022-01-11 DIAGNOSIS — Z8673 Personal history of transient ischemic attack (TIA), and cerebral infarction without residual deficits: Secondary | ICD-10-CM

## 2022-01-11 DIAGNOSIS — Z9181 History of falling: Secondary | ICD-10-CM

## 2022-01-11 DIAGNOSIS — S5011XA Contusion of right forearm, initial encounter: Secondary | ICD-10-CM | POA: Diagnosis not present

## 2022-01-11 DIAGNOSIS — W19XXXA Unspecified fall, initial encounter: Secondary | ICD-10-CM

## 2022-01-11 DIAGNOSIS — R269 Unspecified abnormalities of gait and mobility: Secondary | ICD-10-CM

## 2022-01-11 DIAGNOSIS — S0083XA Contusion of other part of head, initial encounter: Secondary | ICD-10-CM | POA: Diagnosis not present

## 2022-01-11 DIAGNOSIS — R296 Repeated falls: Secondary | ICD-10-CM | POA: Diagnosis not present

## 2022-01-14 DIAGNOSIS — F0390 Unspecified dementia without behavioral disturbance: Secondary | ICD-10-CM | POA: Diagnosis not present

## 2022-01-14 DIAGNOSIS — R4181 Age-related cognitive decline: Secondary | ICD-10-CM | POA: Diagnosis not present

## 2022-01-14 DIAGNOSIS — R634 Abnormal weight loss: Secondary | ICD-10-CM | POA: Diagnosis not present

## 2022-01-16 DIAGNOSIS — R4181 Age-related cognitive decline: Secondary | ICD-10-CM | POA: Diagnosis not present

## 2022-01-16 DIAGNOSIS — R1310 Dysphagia, unspecified: Secondary | ICD-10-CM | POA: Diagnosis not present

## 2022-01-16 DIAGNOSIS — F0390 Unspecified dementia without behavioral disturbance: Secondary | ICD-10-CM | POA: Diagnosis not present

## 2022-01-18 DIAGNOSIS — R2689 Other abnormalities of gait and mobility: Secondary | ICD-10-CM | POA: Diagnosis not present

## 2022-01-18 DIAGNOSIS — R269 Unspecified abnormalities of gait and mobility: Secondary | ICD-10-CM | POA: Diagnosis not present

## 2022-01-18 DIAGNOSIS — Z8673 Personal history of transient ischemic attack (TIA), and cerebral infarction without residual deficits: Secondary | ICD-10-CM

## 2022-01-18 DIAGNOSIS — Z9181 History of falling: Secondary | ICD-10-CM

## 2022-01-18 DIAGNOSIS — M6281 Muscle weakness (generalized): Secondary | ICD-10-CM | POA: Diagnosis not present

## 2022-01-18 DIAGNOSIS — F0390 Unspecified dementia without behavioral disturbance: Secondary | ICD-10-CM | POA: Diagnosis not present

## 2022-01-18 DIAGNOSIS — W19XXXA Unspecified fall, initial encounter: Secondary | ICD-10-CM

## 2022-01-23 DIAGNOSIS — F0390 Unspecified dementia without behavioral disturbance: Secondary | ICD-10-CM

## 2022-01-23 DIAGNOSIS — W19XXXS Unspecified fall, sequela: Secondary | ICD-10-CM

## 2022-01-23 DIAGNOSIS — R269 Unspecified abnormalities of gait and mobility: Secondary | ICD-10-CM | POA: Diagnosis not present

## 2022-01-23 DIAGNOSIS — M6281 Muscle weakness (generalized): Secondary | ICD-10-CM | POA: Diagnosis not present

## 2022-01-23 DIAGNOSIS — S81811A Laceration without foreign body, right lower leg, initial encounter: Secondary | ICD-10-CM | POA: Diagnosis not present

## 2022-01-23 DIAGNOSIS — N390 Urinary tract infection, site not specified: Secondary | ICD-10-CM | POA: Diagnosis not present

## 2022-01-23 DIAGNOSIS — S61411A Laceration without foreign body of right hand, initial encounter: Secondary | ICD-10-CM

## 2022-01-23 DIAGNOSIS — R451 Restlessness and agitation: Secondary | ICD-10-CM

## 2022-01-28 DIAGNOSIS — N1832 Chronic kidney disease, stage 3b: Secondary | ICD-10-CM

## 2022-01-28 DIAGNOSIS — F0390 Unspecified dementia without behavioral disturbance: Secondary | ICD-10-CM

## 2022-01-28 DIAGNOSIS — R269 Unspecified abnormalities of gait and mobility: Secondary | ICD-10-CM

## 2022-01-28 DIAGNOSIS — G894 Chronic pain syndrome: Secondary | ICD-10-CM

## 2022-01-28 DIAGNOSIS — M6281 Muscle weakness (generalized): Secondary | ICD-10-CM

## 2022-01-28 DIAGNOSIS — I129 Hypertensive chronic kidney disease with stage 1 through stage 4 chronic kidney disease, or unspecified chronic kidney disease: Secondary | ICD-10-CM | POA: Diagnosis not present

## 2022-01-28 DIAGNOSIS — Z681 Body mass index (BMI) 19 or less, adult: Secondary | ICD-10-CM

## 2022-01-28 DIAGNOSIS — M1990 Unspecified osteoarthritis, unspecified site: Secondary | ICD-10-CM

## 2022-01-28 DIAGNOSIS — R7303 Prediabetes: Secondary | ICD-10-CM

## 2022-02-08 DIAGNOSIS — L600 Ingrowing nail: Secondary | ICD-10-CM | POA: Diagnosis not present

## 2022-02-08 DIAGNOSIS — B351 Tinea unguium: Secondary | ICD-10-CM | POA: Diagnosis not present

## 2022-02-08 DIAGNOSIS — L603 Nail dystrophy: Secondary | ICD-10-CM | POA: Diagnosis not present

## 2022-02-11 DIAGNOSIS — W050XXA Fall from non-moving wheelchair, initial encounter: Secondary | ICD-10-CM

## 2022-02-11 DIAGNOSIS — R269 Unspecified abnormalities of gait and mobility: Secondary | ICD-10-CM | POA: Diagnosis not present

## 2022-02-11 DIAGNOSIS — M6281 Muscle weakness (generalized): Secondary | ICD-10-CM | POA: Diagnosis not present

## 2022-02-11 DIAGNOSIS — Z9181 History of falling: Secondary | ICD-10-CM

## 2022-02-11 DIAGNOSIS — R296 Repeated falls: Secondary | ICD-10-CM | POA: Diagnosis not present

## 2022-02-11 DIAGNOSIS — R2689 Other abnormalities of gait and mobility: Secondary | ICD-10-CM | POA: Diagnosis not present

## 2022-02-11 DIAGNOSIS — F0390 Unspecified dementia without behavioral disturbance: Secondary | ICD-10-CM

## 2022-02-13 DIAGNOSIS — R4181 Age-related cognitive decline: Secondary | ICD-10-CM | POA: Diagnosis not present

## 2022-02-13 DIAGNOSIS — F0390 Unspecified dementia without behavioral disturbance: Secondary | ICD-10-CM | POA: Diagnosis not present

## 2022-02-13 DIAGNOSIS — R635 Abnormal weight gain: Secondary | ICD-10-CM | POA: Diagnosis not present

## 2022-02-18 DIAGNOSIS — F03918 Unspecified dementia, unspecified severity, with other behavioral disturbance: Secondary | ICD-10-CM | POA: Diagnosis not present

## 2022-02-18 DIAGNOSIS — R451 Restlessness and agitation: Secondary | ICD-10-CM | POA: Diagnosis not present

## 2022-02-18 DIAGNOSIS — F419 Anxiety disorder, unspecified: Secondary | ICD-10-CM | POA: Diagnosis not present

## 2022-02-18 DIAGNOSIS — R4181 Age-related cognitive decline: Secondary | ICD-10-CM | POA: Diagnosis not present

## 2022-03-06 DIAGNOSIS — M6281 Muscle weakness (generalized): Secondary | ICD-10-CM

## 2022-03-06 DIAGNOSIS — R269 Unspecified abnormalities of gait and mobility: Secondary | ICD-10-CM

## 2022-03-06 DIAGNOSIS — W050XXA Fall from non-moving wheelchair, initial encounter: Secondary | ICD-10-CM

## 2022-03-06 DIAGNOSIS — Z9181 History of falling: Secondary | ICD-10-CM

## 2022-03-06 DIAGNOSIS — R2689 Other abnormalities of gait and mobility: Secondary | ICD-10-CM

## 2022-03-06 DIAGNOSIS — F0390 Unspecified dementia without behavioral disturbance: Secondary | ICD-10-CM

## 2022-03-14 DIAGNOSIS — F0390 Unspecified dementia without behavioral disturbance: Secondary | ICD-10-CM | POA: Diagnosis not present

## 2022-04-08 DEATH — deceased
